# Patient Record
Sex: Male | Born: 1950 | Race: White | Hispanic: No | Marital: Married | Smoking: Never smoker
Health system: Southern US, Community
[De-identification: ages and names within clinical notes are randomized; demographics above are authoritative.]

## PROBLEM LIST (undated history)

## (undated) DIAGNOSIS — N529 Male erectile dysfunction, unspecified: Secondary | ICD-10-CM

## (undated) DIAGNOSIS — Z973 Presence of spectacles and contact lenses: Secondary | ICD-10-CM

## (undated) DIAGNOSIS — M1990 Unspecified osteoarthritis, unspecified site: Secondary | ICD-10-CM

## (undated) DIAGNOSIS — N401 Enlarged prostate with lower urinary tract symptoms: Secondary | ICD-10-CM

## (undated) DIAGNOSIS — R351 Nocturia: Secondary | ICD-10-CM

## (undated) DIAGNOSIS — K219 Gastro-esophageal reflux disease without esophagitis: Secondary | ICD-10-CM

## (undated) HISTORY — PX: TRICEPS TENDON REPAIR: SHX2577

## (undated) HISTORY — PX: UPPER GI ENDOSCOPY: SHX6162

## (undated) HISTORY — PX: TONSILLECTOMY AND ADENOIDECTOMY: SUR1326

## (undated) HISTORY — PX: KNEE ARTHROSCOPY: SUR90

## (undated) HISTORY — PX: JOINT REPLACEMENT: SHX530

## (undated) HISTORY — PX: APPENDECTOMY: SHX54

---

## 1985-08-07 HISTORY — PX: LUMBAR SPINE SURGERY: SHX701

## 2010-08-07 HISTORY — PX: BICEPS TENDON REPAIR: SHX566

## 2010-08-07 HISTORY — PX: LAPAROSCOPIC CHOLECYSTECTOMY: SUR755

## 2016-12-16 DIAGNOSIS — R079 Chest pain, unspecified: Secondary | ICD-10-CM | POA: Diagnosis not present

## 2017-04-07 HISTORY — PX: COLONOSCOPY: SHX174

## 2018-02-26 DIAGNOSIS — M1712 Unilateral primary osteoarthritis, left knee: Secondary | ICD-10-CM | POA: Diagnosis not present

## 2018-02-26 DIAGNOSIS — M25562 Pain in left knee: Secondary | ICD-10-CM | POA: Diagnosis not present

## 2018-02-26 DIAGNOSIS — S76311A Strain of muscle, fascia and tendon of the posterior muscle group at thigh level, right thigh, initial encounter: Secondary | ICD-10-CM | POA: Diagnosis not present

## 2018-02-26 DIAGNOSIS — Z9889 Other specified postprocedural states: Secondary | ICD-10-CM | POA: Diagnosis not present

## 2018-02-26 DIAGNOSIS — G8929 Other chronic pain: Secondary | ICD-10-CM | POA: Diagnosis not present

## 2018-03-11 DIAGNOSIS — R7303 Prediabetes: Secondary | ICD-10-CM | POA: Diagnosis not present

## 2018-03-11 DIAGNOSIS — Z01818 Encounter for other preprocedural examination: Secondary | ICD-10-CM | POA: Diagnosis not present

## 2018-03-11 DIAGNOSIS — N529 Male erectile dysfunction, unspecified: Secondary | ICD-10-CM | POA: Diagnosis not present

## 2018-03-11 DIAGNOSIS — M150 Primary generalized (osteo)arthritis: Secondary | ICD-10-CM | POA: Diagnosis not present

## 2018-03-12 DIAGNOSIS — I440 Atrioventricular block, first degree: Secondary | ICD-10-CM | POA: Diagnosis not present

## 2018-03-25 ENCOUNTER — Other Ambulatory Visit: Payer: Self-pay | Admitting: Orthopedic Surgery

## 2018-04-25 NOTE — Patient Instructions (Addendum)
Your procedure is scheduled on: Monday, Sept. 30, 2019   Surgery Time:  9:45AM-11:30AM   Report to [facility]  Entrance    Report to admitting at 7:15 AM   Call this number if you have problems the morning of surgery [phone]   Do not eat food or drink liquids :After Midnight.   Brush your teeth the morning of surgery.   Do NOT smoke after Midnight   Take these medicines the morning of surgery with A SIP OF WATER: None                               You may not have any metal on your body including  jewelry, and body piercings             Do not wear lotions, powders, perfumes/cologne, or deodorant                         Men may shave face and neck.   Do not bring valuables to the hospital. [place].   Contacts, dentures or bridgework may not be worn into surgery.   Leave suitcase in the car. After surgery it may be brought to your room.    Special Instructions: Bring a copy of your healthcare power of attorney and living will documents         the day of surgery if you haven't scanned them in before.              Please read over the following fact sheets you were given:  [facility] - Preparing for Surgery Before surgery, you can play an important role.  Because skin is not sterile, your skin needs to be as free of germs as possible.  You can reduce the number of germs on your skin by washing with CHG (chlorahexidine gluconate) soap before surgery.  CHG is an antiseptic cleaner which kills germs and bonds with the skin to continue killing germs even after washing. Please DO NOT use if you have an allergy to CHG or antibacterial soaps.  If your skin becomes reddened/irritated stop using the CHG and inform your nurse when you arrive at Short Stay. Do not shave (including legs and underarms) for at least 48 hours prior to the first CHG shower.  You may shave your face/neck.  Please follow these  instructions carefully:  1.  Shower with CHG Soap the night before surgery and the  morning of surgery.  2.  If you choose to wash your hair, wash your hair first as usual with your normal  shampoo.  3.  After you shampoo, rinse your hair and body thoroughly to remove the shampoo.                             4.  Use CHG as you would any other liquid soap.  You can apply chg directly to the skin and wash.  Gently with a scrungie or clean washcloth.  5.  Apply the CHG Soap to your body ONLY FROM THE NECK DOWN.   Do   not use on face/ open  Wound or open sores. Avoid contact with eyes, ears mouth and   genitals (private parts).                       Wash face,  Genitals (private parts) with your normal soap.             6.  Wash thoroughly, paying special attention to the area where your    surgery  will be performed.  7.  Thoroughly rinse your body with warm water from the neck down.  8.  DO NOT shower/wash with your normal soap after using and rinsing off the CHG Soap.                9.  Pat yourself dry with a clean towel.            10.  Wear clean pajamas.            11.  Place clean sheets on your bed the night of your first shower and do not  sleep with pets. Day of Surgery : Do not apply any lotions/deodorants the morning of surgery.  Please wear clean clothes to the hospital/surgery center.  FAILURE TO FOLLOW THESE INSTRUCTIONS MAY RESULT IN THE CANCELLATION OF YOUR SURGERY  PATIENT SIGNATURE_________________________________  NURSE SIGNATURE__________________________________  ________________________________________________________________________   Adam Phenix  An incentive spirometer is a tool that can help keep your lungs clear and active. This tool measures how well you are filling your lungs with each breath. Taking long deep breaths may help reverse or decrease the chance of developing breathing (pulmonary) problems (especially infection)  following:  A long period of time when you are unable to move or be active. BEFORE THE PROCEDURE   If the spirometer includes an indicator to show your best effort, your nurse or respiratory therapist will set it to a desired goal.  If possible, sit up straight or lean slightly forward. Try not to slouch.  Hold the incentive spirometer in an upright position. INSTRUCTIONS FOR USE  1. Sit on the edge of your bed if possible, or sit up as far as you can in bed or on a chair. 2. Hold the incentive spirometer in an upright position. 3. Breathe out normally. 4. Place the mouthpiece in your mouth and seal your lips tightly around it. 5. Breathe in slowly and as deeply as possible, raising the piston or the ball toward the top of the column. 6. Hold your breath for 3-5 seconds or for as long as possible. Allow the piston or ball to fall to the bottom of the column. 7. Remove the mouthpiece from your mouth and breathe out normally. 8. Rest for a few seconds and repeat Steps 1 through 7 at least 10 times every 1-2 hours when you are awake. Take your time and take a few normal breaths between deep breaths. 9. The spirometer may include an indicator to show your best effort. Use the indicator as a goal to work toward during each repetition. 10. After each set of 10 deep breaths, practice coughing to be sure your lungs are clear. If you have an incision (the cut made at the time of surgery), support your incision when coughing by placing a pillow or rolled up towels firmly against it. Once you are able to get out of bed, walk around indoors and cough well. You may stop using the incentive spirometer when instructed by your caregiver.  RISKS AND COMPLICATIONS  Take your time  so you do not get dizzy or light-headed.  If you are in pain, you may need to take or ask for pain medication before doing incentive spirometry. It is harder to take a deep breath if you are having pain. AFTER USE  Rest and  breathe slowly and easily.  It can be helpful to keep track of a log of your progress. Your caregiver can provide you with a simple table to help with this. If you are using the spirometer at home, follow these instructions: [place] IF:   You are having difficultly using the spirometer.  You have trouble using the spirometer as often as instructed.  Your pain medication is not giving enough relief while using the spirometer.  You develop fever of 100.5 F (38.1 C) or higher. SEEK IMMEDIATE MEDICAL CARE IF:   You cough up bloody sputum that had not been present before.  You develop fever of 102 F (38.9 C) or greater.  You develop worsening pain at or near the incision site. MAKE SURE YOU:   Understand these instructions.  Will watch your condition.  Will get help right away if you are not doing well or get worse. Document Released: 12/04/2006 Document Revised: 10/16/2011 Document Reviewed: 02/04/2007 [facility] Patient Information 2014 [place], [place].   ________________________________________________________________________

## 2018-04-29 ENCOUNTER — Other Ambulatory Visit: Payer: Self-pay

## 2018-04-29 ENCOUNTER — Encounter (HOSPITAL_COMMUNITY)
Admission: RE | Admit: 2018-04-29 | Discharge: 2018-04-29 | Disposition: A | Discharge status: Home / Self Care | Payer: PPO | Source: Ambulatory Visit | Attending: Orthopedic Surgery | Admitting: Orthopedic Surgery

## 2018-04-29 ENCOUNTER — Encounter (HOSPITAL_COMMUNITY): Payer: Self-pay

## 2018-04-29 DIAGNOSIS — M1712 Unilateral primary osteoarthritis, left knee: Secondary | ICD-10-CM | POA: Diagnosis not present

## 2018-04-29 DIAGNOSIS — Z01812 Encounter for preprocedural laboratory examination: Secondary | ICD-10-CM | POA: Insufficient documentation

## 2018-04-29 LAB — CBC WITH DIFFERENTIAL/PLATELET
BASOS ABS: 0 10*3/uL (ref 0.0–0.1)
Basophils Relative: 1 %
Eosinophils Absolute: 0.2 10*3/uL (ref 0.0–0.7)
Eosinophils Relative: 3 %
HEMATOCRIT: 40.6 % (ref 39.0–52.0)
HEMOGLOBIN: 13.8 g/dL (ref 13.0–17.0)
LYMPHS ABS: 1.3 10*3/uL (ref 0.7–4.0)
Lymphocytes Relative: 19 %
MCH: 31.6 pg (ref 26.0–34.0)
MCHC: 34 g/dL (ref 30.0–36.0)
MCV: 92.9 fL (ref 78.0–100.0)
Monocytes Absolute: 0.6 10*3/uL (ref 0.1–1.0)
Monocytes Relative: 9 %
NEUTROS ABS: 4.6 10*3/uL (ref 1.7–7.7)
NEUTROS PCT: 68 %
PLATELETS: 204 10*3/uL (ref 150–400)
RBC: 4.37 MIL/uL (ref 4.22–5.81)
RDW: 12.7 % (ref 11.5–15.5)
WBC: 6.7 10*3/uL (ref 4.0–10.5)

## 2018-04-29 LAB — COMPREHENSIVE METABOLIC PANEL
ALK PHOS: 47 U/L (ref 38–126)
ALT: 21 U/L (ref 0–44)
AST: 23 U/L (ref 15–41)
Albumin: 4.1 g/dL (ref 3.5–5.0)
Anion gap: 7 (ref 5–15)
BILIRUBIN TOTAL: 0.6 mg/dL (ref 0.3–1.2)
BUN: 25 mg/dL — AB (ref 8–23)
CALCIUM: 9.2 mg/dL (ref 8.9–10.3)
CHLORIDE: 102 mmol/L (ref 98–111)
CO2: 29 mmol/L (ref 22–32)
CREATININE: 0.9 mg/dL (ref 0.61–1.24)
Glucose, Bld: 105 mg/dL — ABNORMAL HIGH (ref 70–99)
Potassium: 4.2 mmol/L (ref 3.5–5.1)
Sodium: 138 mmol/L (ref 135–145)
Total Protein: 6.7 g/dL (ref 6.5–8.1)

## 2018-04-29 LAB — SURGICAL PCR SCREEN
MRSA, PCR: NEGATIVE
Staphylococcus aureus: NEGATIVE

## 2018-05-05 MED ORDER — BUPIVACAINE LIPOSOME 1.3 % IJ SUSP
20.0000 mL | Freq: Once | INTRAMUSCULAR | Status: DC
  Filled 2018-05-05: qty 20

## 2018-05-06 ENCOUNTER — Encounter (HOSPITAL_COMMUNITY)
Admission: RE | Disposition: A | Discharge status: Home / Self Care | Payer: Self-pay | Source: Ambulatory Visit | Attending: Orthopedic Surgery

## 2018-05-06 ENCOUNTER — Encounter (HOSPITAL_COMMUNITY): Payer: Self-pay | Admitting: *Deleted

## 2018-05-06 ENCOUNTER — Ambulatory Visit (HOSPITAL_COMMUNITY): Payer: PPO | Admitting: Anesthesiology

## 2018-05-06 ENCOUNTER — Observation Stay (HOSPITAL_COMMUNITY)
Admission: RE | Admit: 2018-05-06 | Discharge: 2018-05-07 | Disposition: A | Discharge status: Home / Self Care | Payer: PPO | Source: Ambulatory Visit | Attending: Orthopedic Surgery | Admitting: Orthopedic Surgery

## 2018-05-06 ENCOUNTER — Other Ambulatory Visit: Payer: Self-pay

## 2018-05-06 DIAGNOSIS — M25562 Pain in left knee: Secondary | ICD-10-CM | POA: Diagnosis present

## 2018-05-06 DIAGNOSIS — M25762 Osteophyte, left knee: Secondary | ICD-10-CM | POA: Insufficient documentation

## 2018-05-06 DIAGNOSIS — Z96659 Presence of unspecified artificial knee joint: Secondary | ICD-10-CM

## 2018-05-06 DIAGNOSIS — M1712 Unilateral primary osteoarthritis, left knee: Secondary | ICD-10-CM | POA: Diagnosis not present

## 2018-05-06 DIAGNOSIS — G8918 Other acute postprocedural pain: Secondary | ICD-10-CM | POA: Diagnosis not present

## 2018-05-06 HISTORY — PX: TOTAL KNEE ARTHROPLASTY: SHX125

## 2018-05-06 SURGERY — ARTHROPLASTY, KNEE, TOTAL
Anesthesia: Regional | Site: Knee | Laterality: Left

## 2018-05-06 MED ORDER — FENTANYL CITRATE (PF) 100 MCG/2ML IJ SOLN
100.0000 ug | Freq: Once | INTRAMUSCULAR | Status: AC
  Administered 2018-05-06: 50 ug via INTRAVENOUS
  Filled 2018-05-06: qty 2

## 2018-05-06 MED ORDER — METOCLOPRAMIDE HCL 5 MG PO TABS: 5.0000 mg | ORAL_TABLET | Freq: Three times a day (TID) | ORAL | Status: DC | PRN

## 2018-05-06 MED ORDER — PROPOFOL 500 MG/50ML IV EMUL
INTRAVENOUS | Status: DC | PRN
  Administered 2018-05-06: 40 ug/kg/min via INTRAVENOUS

## 2018-05-06 MED ORDER — DIPHENHYDRAMINE HCL 12.5 MG/5ML PO ELIX: 12.5000 mg | ORAL_SOLUTION | ORAL | Status: DC | PRN

## 2018-05-06 MED ORDER — SODIUM CHLORIDE 0.9 % IV SOLN
INTRAVENOUS | Status: DC
  Administered 2018-05-06 – 2018-05-07 (×2): via INTRAVENOUS

## 2018-05-06 MED ORDER — TRAMADOL HCL 50 MG PO TABS
50.0000 mg | ORAL_TABLET | Freq: Four times a day (QID) | ORAL | Status: DC
  Administered 2018-05-06 – 2018-05-07 (×4): 50 mg via ORAL
  Filled 2018-05-06 (×4): qty 1

## 2018-05-06 MED ORDER — SODIUM CHLORIDE 0.9 % IV SOLN
INTRAVENOUS | Status: DC | PRN
  Administered 2018-05-06: 40 ug/min via INTRAVENOUS

## 2018-05-06 MED ORDER — LACTATED RINGERS IV SOLN
INTRAVENOUS | Status: DC
  Administered 2018-05-06 (×2): via INTRAVENOUS

## 2018-05-06 MED ORDER — SODIUM CHLORIDE 0.9 % IJ SOLN
INTRAMUSCULAR | Status: AC
  Filled 2018-05-06: qty 20

## 2018-05-06 MED ORDER — FERROUS SULFATE 325 (65 FE) MG PO TABS
325.0000 mg | ORAL_TABLET | Freq: Three times a day (TID) | ORAL | Status: DC
  Administered 2018-05-07: 325 mg via ORAL
  Filled 2018-05-06: qty 1

## 2018-05-06 MED ORDER — TRANEXAMIC ACID 1000 MG/10ML IV SOLN
1000.0000 mg | Freq: Once | INTRAVENOUS | Status: AC
  Administered 2018-05-06: 1000 mg via INTRAVENOUS
  Filled 2018-05-06: qty 1000

## 2018-05-06 MED ORDER — CEFAZOLIN SODIUM-DEXTROSE 2-4 GM/100ML-% IV SOLN
2.0000 g | Freq: Four times a day (QID) | INTRAVENOUS | Status: AC
  Administered 2018-05-06 (×2): 2 g via INTRAVENOUS
  Filled 2018-05-06 (×2): qty 100

## 2018-05-06 MED ORDER — DOCUSATE SODIUM 100 MG PO CAPS
100.0000 mg | ORAL_CAPSULE | Freq: Two times a day (BID) | ORAL | Status: DC
  Administered 2018-05-06 – 2018-05-07 (×2): 100 mg via ORAL
  Filled 2018-05-06 (×2): qty 1

## 2018-05-06 MED ORDER — ASPIRIN EC 325 MG PO TBEC
325.0000 mg | DELAYED_RELEASE_TABLET | Freq: Two times a day (BID) | ORAL | Status: DC
  Administered 2018-05-07: 325 mg via ORAL
  Filled 2018-05-06: qty 1

## 2018-05-06 MED ORDER — GABAPENTIN 300 MG PO CAPS
300.0000 mg | ORAL_CAPSULE | Freq: Three times a day (TID) | ORAL | Status: DC
  Administered 2018-05-06 – 2018-05-07 (×3): 300 mg via ORAL
  Filled 2018-05-06 (×3): qty 1

## 2018-05-06 MED ORDER — HYDROMORPHONE HCL 1 MG/ML IJ SOLN: 0.5000 mg | INTRAMUSCULAR | Status: DC | PRN

## 2018-05-06 MED ORDER — ONDANSETRON HCL 4 MG PO TABS: 4.0000 mg | ORAL_TABLET | Freq: Four times a day (QID) | ORAL | Status: DC | PRN

## 2018-05-06 MED ORDER — TRANEXAMIC ACID 1000 MG/10ML IV SOLN
1000.0000 mg | INTRAVENOUS | Status: AC
  Administered 2018-05-06: 1000 mg via INTRAVENOUS
  Filled 2018-05-06: qty 10

## 2018-05-06 MED ORDER — FLEET ENEMA 7-19 GM/118ML RE ENEM: 1.0000 | ENEMA | Freq: Once | RECTAL | Status: DC | PRN

## 2018-05-06 MED ORDER — SODIUM CHLORIDE 0.9% FLUSH
INTRAVENOUS | Status: DC | PRN
  Administered 2018-05-06: 20 mL

## 2018-05-06 MED ORDER — ACETAMINOPHEN 500 MG PO TABS
1000.0000 mg | ORAL_TABLET | Freq: Four times a day (QID) | ORAL | Status: AC
  Administered 2018-05-06 – 2018-05-07 (×4): 1000 mg via ORAL
  Filled 2018-05-06 (×4): qty 2

## 2018-05-06 MED ORDER — BUPIVACAINE IN DEXTROSE 0.75-8.25 % IT SOLN
INTRATHECAL | Status: DC | PRN
  Administered 2018-05-06: 1.8 mL via INTRATHECAL

## 2018-05-06 MED ORDER — CHLORHEXIDINE GLUCONATE 4 % EX LIQD: 60.0000 mL | Freq: Once | CUTANEOUS | Status: DC

## 2018-05-06 MED ORDER — DEXAMETHASONE SODIUM PHOSPHATE 10 MG/ML IJ SOLN
10.0000 mg | Freq: Once | INTRAMUSCULAR | Status: AC
  Administered 2018-05-07: 10 mg via INTRAVENOUS
  Filled 2018-05-06: qty 1

## 2018-05-06 MED ORDER — ONDANSETRON HCL 4 MG/2ML IJ SOLN
INTRAMUSCULAR | Status: DC | PRN
  Administered 2018-05-06: 4 mg via INTRAVENOUS

## 2018-05-06 MED ORDER — PROPOFOL 10 MG/ML IV BOLUS
INTRAVENOUS | Status: DC | PRN
  Administered 2018-05-06: 30 mg via INTRAVENOUS

## 2018-05-06 MED ORDER — CEFAZOLIN SODIUM-DEXTROSE 2-4 GM/100ML-% IV SOLN
2.0000 g | INTRAVENOUS | Status: AC
  Administered 2018-05-06: 2 g via INTRAVENOUS
  Filled 2018-05-06: qty 100

## 2018-05-06 MED ORDER — ACETAMINOPHEN 500 MG PO TABS
1000.0000 mg | ORAL_TABLET | Freq: Once | ORAL | Status: AC
  Administered 2018-05-06: 1000 mg via ORAL
  Filled 2018-05-06: qty 2

## 2018-05-06 MED ORDER — METHOCARBAMOL 500 MG PO TABS: 500.0000 mg | ORAL_TABLET | Freq: Four times a day (QID) | ORAL | Status: DC | PRN

## 2018-05-06 MED ORDER — PROPOFOL 10 MG/ML IV BOLUS
INTRAVENOUS | Status: AC
  Filled 2018-05-06: qty 60

## 2018-05-06 MED ORDER — PROPOFOL 10 MG/ML IV BOLUS
INTRAVENOUS | Status: AC
  Filled 2018-05-06: qty 20

## 2018-05-06 MED ORDER — BUPIVACAINE LIPOSOME 1.3 % IJ SUSP
INTRAMUSCULAR | Status: DC | PRN
  Administered 2018-05-06: 20 mL

## 2018-05-06 MED ORDER — MIDAZOLAM HCL 2 MG/2ML IJ SOLN
2.0000 mg | Freq: Once | INTRAMUSCULAR | Status: AC
  Administered 2018-05-06: 2 mg via INTRAVENOUS
  Filled 2018-05-06: qty 2

## 2018-05-06 MED ORDER — BISACODYL 5 MG PO TBEC: 5.0000 mg | DELAYED_RELEASE_TABLET | Freq: Every day | ORAL | Status: DC | PRN

## 2018-05-06 MED ORDER — METHOCARBAMOL 500 MG IVPB - SIMPLE MED
500.0000 mg | Freq: Four times a day (QID) | INTRAVENOUS | Status: DC | PRN
  Administered 2018-05-06: 500 mg via INTRAVENOUS
  Filled 2018-05-06: qty 50

## 2018-05-06 MED ORDER — PANTOPRAZOLE SODIUM 40 MG PO TBEC
40.0000 mg | DELAYED_RELEASE_TABLET | Freq: Every day | ORAL | Status: DC
  Administered 2018-05-06 – 2018-05-07 (×2): 40 mg via ORAL
  Filled 2018-05-06 (×2): qty 1

## 2018-05-06 MED ORDER — BUPIVACAINE-EPINEPHRINE (PF) 0.5% -1:200000 IJ SOLN
INTRAMUSCULAR | Status: AC
  Filled 2018-05-06: qty 30

## 2018-05-06 MED ORDER — ROPIVACAINE HCL 5 MG/ML IJ SOLN
INTRAMUSCULAR | Status: DC | PRN
  Administered 2018-05-06: 30 mL via PERINEURAL

## 2018-05-06 MED ORDER — ONDANSETRON HCL 4 MG/2ML IJ SOLN: 4.0000 mg | Freq: Once | INTRAMUSCULAR | Status: DC | PRN

## 2018-05-06 MED ORDER — OXYCODONE HCL 5 MG PO TABS
5.0000 mg | ORAL_TABLET | ORAL | Status: DC | PRN
  Administered 2018-05-07 (×2): 5 mg via ORAL
  Filled 2018-05-06 (×2): qty 1

## 2018-05-06 MED ORDER — FENTANYL CITRATE (PF) 100 MCG/2ML IJ SOLN: 25.0000 ug | INTRAMUSCULAR | Status: DC | PRN

## 2018-05-06 MED ORDER — METHOCARBAMOL 500 MG IVPB - SIMPLE MED
INTRAVENOUS | Status: AC
  Administered 2018-05-06: 500 mg via INTRAVENOUS
  Filled 2018-05-06: qty 50

## 2018-05-06 MED ORDER — SODIUM CHLORIDE 0.9 % IR SOLN
Status: DC | PRN
  Administered 2018-05-06: 1000 mL

## 2018-05-06 MED ORDER — MENTHOL 3 MG MT LOZG: 1.0000 | LOZENGE | OROMUCOSAL | Status: DC | PRN

## 2018-05-06 MED ORDER — STERILE WATER FOR INJECTION IJ SOLN
INTRAMUSCULAR | Status: DC | PRN
  Administered 2018-05-06: 2000 mL

## 2018-05-06 MED ORDER — SENNOSIDES-DOCUSATE SODIUM 8.6-50 MG PO TABS: 1.0000 | ORAL_TABLET | Freq: Every evening | ORAL | Status: DC | PRN

## 2018-05-06 MED ORDER — METOCLOPRAMIDE HCL 5 MG/ML IJ SOLN: 5.0000 mg | Freq: Three times a day (TID) | INTRAMUSCULAR | Status: DC | PRN

## 2018-05-06 MED ORDER — BUPIVACAINE-EPINEPHRINE 0.5% -1:200000 IJ SOLN
INTRAMUSCULAR | Status: DC | PRN
  Administered 2018-05-06: 20 mL

## 2018-05-06 MED ORDER — PHENOL 1.4 % MT LIQD
1.0000 | OROMUCOSAL | Status: DC | PRN
  Filled 2018-05-06: qty 177

## 2018-05-06 MED ORDER — ONDANSETRON HCL 4 MG/2ML IJ SOLN: 4.0000 mg | Freq: Four times a day (QID) | INTRAMUSCULAR | Status: DC | PRN

## 2018-05-06 MED ORDER — GABAPENTIN 300 MG PO CAPS
300.0000 mg | ORAL_CAPSULE | Freq: Once | ORAL | Status: AC
  Administered 2018-05-06: 300 mg via ORAL
  Filled 2018-05-06: qty 1

## 2018-05-06 MED ORDER — ALUM & MAG HYDROXIDE-SIMETH 200-200-20 MG/5ML PO SUSP: 30.0000 mL | ORAL | Status: DC | PRN

## 2018-05-06 MED ORDER — DEXAMETHASONE SODIUM PHOSPHATE 10 MG/ML IJ SOLN
8.0000 mg | Freq: Once | INTRAMUSCULAR | Status: AC
  Administered 2018-05-06: 8 mg via INTRAVENOUS
  Filled 2018-05-06: qty 1

## 2018-05-06 MED ORDER — ZOLPIDEM TARTRATE 5 MG PO TABS: 5.0000 mg | ORAL_TABLET | Freq: Every evening | ORAL | Status: DC | PRN

## 2018-05-06 SURGICAL SUPPLY — 61 items
ARTISURF 10M VEL 10-12 GH KNEE (Knees) ×3 IMPLANT
BAG ZIPLOCK 12X15 (MISCELLANEOUS) ×3 IMPLANT
BANDAGE ACE 6X5 VEL STRL LF (GAUZE/BANDAGES/DRESSINGS) ×3 IMPLANT
BLADE SAGITTAL 13X1.27X60 (BLADE) ×2 IMPLANT
BLADE SAGITTAL 13X1.27X60MM (BLADE) ×1
BLADE SAW SGTL 83.5X18.5 (BLADE) ×3 IMPLANT
BLADE SURG 15 STRL LF DISP TIS (BLADE) ×1 IMPLANT
BLADE SURG 15 STRL SS (BLADE) ×2
BOWL SMART MIX CTS (DISPOSABLE) ×3 IMPLANT
CEMENT BONE SIMPLEX SPEEDSET (Cement) ×6 IMPLANT
CLOSURE WOUND 1/2 X4 (GAUZE/BANDAGES/DRESSINGS) ×2
COVER SURGICAL LIGHT HANDLE (MISCELLANEOUS) ×3 IMPLANT
CUFF TOURN SGL QUICK 34 (TOURNIQUET CUFF) ×2
CUFF TRNQT CYL 34X4X40X1 (TOURNIQUET CUFF) ×1 IMPLANT
DECANTER SPIKE VIAL GLASS SM (MISCELLANEOUS) ×6 IMPLANT
DRAPE INCISE IOBAN 66X45 STRL (DRAPES) ×6 IMPLANT
DRAPE U-SHAPE 47X51 STRL (DRAPES) ×3 IMPLANT
DRESSING AQUACEL AG SP 3.5X10 (GAUZE/BANDAGES/DRESSINGS) ×1 IMPLANT
DRSG AQUACEL AG ADV 3.5X10 (GAUZE/BANDAGES/DRESSINGS) ×3 IMPLANT
DRSG AQUACEL AG SP 3.5X10 (GAUZE/BANDAGES/DRESSINGS) ×3
DURAPREP 26ML APPLICATOR (WOUND CARE) ×6 IMPLANT
ELECT REM PT RETURN 15FT ADLT (MISCELLANEOUS) ×3 IMPLANT
FACESHIELD WRAPAROUND (MASK) ×3 IMPLANT
FEMUR  CMT CCR STD SZ10 L KNEE (Knees) ×2 IMPLANT
FEMUR CMT CCR STD SZ10 L KNEE (Knees) ×1 IMPLANT
FEMUR CMTD CR PERS STD SZ 5 RT (Knees) ×1 IMPLANT
GLOVE BIOGEL M STRL SZ7.5 (GLOVE) ×3 IMPLANT
GLOVE BIOGEL PI IND STRL 7.0 (GLOVE) ×1 IMPLANT
GLOVE BIOGEL PI IND STRL 7.5 (GLOVE) ×2 IMPLANT
GLOVE BIOGEL PI IND STRL 8.5 (GLOVE) ×2 IMPLANT
GLOVE BIOGEL PI INDICATOR 7.0 (GLOVE) ×2
GLOVE BIOGEL PI INDICATOR 7.5 (GLOVE) ×4
GLOVE BIOGEL PI INDICATOR 8.5 (GLOVE) ×4
GLOVE SURG ORTHO 8.0 STRL STRW (GLOVE) ×9 IMPLANT
GLOVE SURG SS PI 7.0 STRL IVOR (GLOVE) ×3 IMPLANT
GLOVE SURG SS PI 7.5 STRL IVOR (GLOVE) ×3 IMPLANT
GOWN STRL REUS W/ TWL LRG LVL3 (GOWN DISPOSABLE) ×1 IMPLANT
GOWN STRL REUS W/ TWL XL LVL3 (GOWN DISPOSABLE) ×2 IMPLANT
GOWN STRL REUS W/TWL 2XL LVL3 (GOWN DISPOSABLE) ×6 IMPLANT
GOWN STRL REUS W/TWL LRG LVL3 (GOWN DISPOSABLE) ×2
GOWN STRL REUS W/TWL XL LVL3 (GOWN DISPOSABLE) ×4
HANDPIECE INTERPULSE COAX TIP (DISPOSABLE) ×2
HOLDER FOLEY CATH W/STRAP (MISCELLANEOUS) ×3 IMPLANT
HOOD PEEL AWAY FLYTE STAYCOOL (MISCELLANEOUS) ×9 IMPLANT
MANIFOLD NEPTUNE II (INSTRUMENTS) ×3 IMPLANT
PACK TOTAL KNEE CUSTOM (KITS) ×3 IMPLANT
POSITIONER SURGICAL ARM (MISCELLANEOUS) ×3 IMPLANT
SET HNDPC FAN SPRY TIP SCT (DISPOSABLE) ×1 IMPLANT
STEM COMP PATELLA VE 38 (Knees) ×3 IMPLANT
STEM TIBIA 5 DEG SZ G L KNEE (Knees) ×1 IMPLANT
STRIP CLOSURE SKIN 1/2X4 (GAUZE/BANDAGES/DRESSINGS) ×4 IMPLANT
SUT BONE WAX W31G (SUTURE) ×3 IMPLANT
SUT MNCRL AB 4-0 PS2 18 (SUTURE) ×3 IMPLANT
SUT STRATAFIX 0 PDS 27 VIOLET (SUTURE) ×3
SUT STRATAFIX PDS+ 0 24IN (SUTURE) ×3 IMPLANT
SUTURE STRATFX 0 PDS 27 VIOLET (SUTURE) ×1 IMPLANT
SYR CONTROL 10ML LL (SYRINGE) ×6 IMPLANT
TIBIA STEM 5 DEG SZ G L KNEE (Knees) ×3 IMPLANT
TRAY FOLEY MTR SLVR 16FR STAT (SET/KITS/TRAYS/PACK) ×3 IMPLANT
WRAP KNEE MAXI GEL POST OP (GAUZE/BANDAGES/DRESSINGS) ×3 IMPLANT
YANKAUER SUCT BULB TIP 10FT TU (MISCELLANEOUS) ×3 IMPLANT

## 2018-05-06 NOTE — Anesthesia Postprocedure Evaluation (Signed)
Anesthesia Post Note  Patient: Philip Preston  Procedure(s) Performed: LEFT TOTAL KNEE ARTHROPLASTY (Left Knee)     Patient location during evaluation: PACU Anesthesia Type: Regional and Spinal Level of consciousness: oriented and awake and alert Pain management: pain level controlled Vital Signs Assessment: post-procedure vital signs reviewed and stable Respiratory status: spontaneous breathing, respiratory function stable and patient connected to nasal cannula oxygen Cardiovascular status: blood pressure returned to baseline and stable Postop Assessment: no headache, no backache, no apparent nausea or vomiting and spinal receding Anesthetic complications: no    Last Vitals:  Vitals:   05/06/18 1356 05/06/18 1455  BP: 129/76 126/75  Pulse: (!) 59 69  Resp: 14 14  Temp: (!) 36.3 C   SpO2: 100% 100%    Last Pain:  Vitals:   05/06/18 1452  TempSrc:   PainSc: 3                  Ryan P Ellender

## 2018-05-06 NOTE — Anesthesia Procedure Notes (Signed)
Anesthesia Regional Block: Adductor canal block   Pre-Anesthetic Checklist: ,, timeout performed, Correct Patient, Correct Site, Correct Laterality, Correct Procedure,, site marked, risks and benefits discussed, Surgical consent,  Pre-op evaluation,  At surgeon's request and post-op pain management  Laterality: Left  Prep: chloraprep       Needles:  Injection technique: Single-shot  Needle Type: Echogenic Stimulator Needle     Needle Length: 10cm  Needle Gauge: 21     Additional Needles:   Procedures:,,,, ultrasound used (permanent image in chart),,,,  Narrative:  Start time: 05/06/2018 9:00 AM End time: 05/06/2018 9:10 AM Injection made incrementally with aspirations every 5 mL.  Performed by: Personally  Anesthesiologist: Murvin Natal, MD  Additional Notes: Functioning IV was confirmed and monitors were applied. A time-out was performed. Hand hygiene and sterile gloves were used. The thigh was placed in a frog-leg position and prepped in a sterile fashion. A 152mm 21ga [place] echogenic stimulator needle was placed using ultrasound guidance.  Negative aspiration and negative test dose prior to incremental administration of local anesthetic. The patient tolerated the procedure well.

## 2018-05-06 NOTE — Anesthesia Preprocedure Evaluation (Addendum)
Anesthesia Evaluation  Patient identified by MRN, date of birth, ID band Patient awake    Reviewed: Allergy & Precautions, NPO status , Patient's Chart, lab work & pertinent test results  Airway Mallampati: III  TM Distance: >3 FB Neck ROM: Full    Dental no notable dental hx.    Pulmonary neg pulmonary ROS,    Pulmonary exam normal breath sounds clear to auscultation       Cardiovascular negative cardio ROS Normal cardiovascular exam Rhythm:Regular Rate:Normal     Neuro/Psych negative neurological ROS  negative psych ROS   GI/Hepatic negative GI ROS, Neg liver ROS,   Endo/Other  negative endocrine ROS  Renal/GU negative Renal ROS     Musculoskeletal  (+) Arthritis ,   Abdominal   Peds  Hematology negative hematology ROS (+)   Anesthesia Other Findings Left knee osteoarthritis  Reproductive/Obstetrics                            Anesthesia Physical Anesthesia Plan  ASA: II  Anesthesia Plan: Regional and Spinal   Post-op Pain Management:    Induction: Intravenous  PONV Risk Score and Plan: 1 and Propofol infusion and Treatment may vary due to age or medical condition  Airway Management Planned: Natural Airway  Additional Equipment:   Intra-op Plan:   Post-operative Plan:   Informed Consent: I have reviewed the patients History and Physical, chart, labs and discussed the procedure including the risks, benefits and alternatives for the proposed anesthesia with the patient or authorized representative who has indicated his/her understanding and acceptance.   Dental advisory given  Plan Discussed with: CRNA  Anesthesia Plan Comments:        Anesthesia Quick Evaluation

## 2018-05-06 NOTE — Plan of Care (Signed)
Plan of care 

## 2018-05-06 NOTE — Anesthesia Procedure Notes (Deleted)
Spinal  Patient location during procedure: OR Start time: 05/06/2018 9:31 AM End time: 05/06/2018 9:36 AM Reason for block: at surgeon's request Staffing Resident/CRNA: Anne Fu, CRNA Performed: anesthesiologist  Preanesthetic Checklist Completed: patient identified, site marked, surgical consent, pre-op evaluation, timeout performed, IV checked, risks and benefits discussed and monitors and equipment checked Spinal Block Patient position: sitting Prep: Betadine and DuraPrep Patient monitoring: heart rate, continuous pulse ox and blood pressure Approach: right paramedian Location: L2-3 Injection technique: single-shot Needle Needle type: Sprotte  Needle gauge: 24 G Needle length: 9 cm Assessment Sensory level: T6 Additional Notes Expiration date of kit checked and confirmed. Patient tolerated procedure well, without complications.

## 2018-05-06 NOTE — H&P (Signed)
Philip Preston MRN:  [id number] DOB/SEX:  1951-01-31/male  CHIEF COMPLAINT:  Painful left Knee  HISTORY: Patient is a 67 y.o. male presented with a history of pain in the left knee. Onset of symptoms was gradual starting 10 years ago with gradually worsening course since that time. Patient has been treated conservatively with over-the-counter NSAIDs and activity modification. Patient currently rates pain in the knee at 10 out of 10 with activity. There is pain at night.  PAST MEDICAL HISTORY: There are no active problems to display for this patient.  Past Medical History:  Diagnosis Date  . Arthritis    Past Surgical History:  Procedure Laterality Date  . APPENDECTOMY     age 31   . BACK SURGERY    . BICEPS TENDON REPAIR Left   . CHOLECYSTECTOMY    . COLONOSCOPY  04/2017  . KNEE ARTHROSCOPY Left   . TONSILLECTOMY AND ADENOIDECTOMY     childhood  . TRICEPS TENDON REPAIR Right    x2 second procedure to find infection source  . UPPER GI ENDOSCOPY       MEDICATIONS:   No medications prior to admission.    ALLERGIES:  No Known Allergies  REVIEW OF SYSTEMS:  A comprehensive review of systems was negative except for: Musculoskeletal: positive for arthralgias and bone pain   FAMILY HISTORY:  No family history on file.  SOCIAL HISTORY:   Social History   Tobacco Use  . Smoking status: Never Smoker  . Smokeless tobacco: Never Used  Substance Use Topics  . Alcohol use: Never    Frequency: Never     EXAMINATION:  Vital signs in last 24 hours:    There were no vitals taken for this visit.  General Appearance:    Alert, cooperative, no distress, appears stated age  Head:    Normocephalic, without obvious abnormality, atraumatic  Eyes:    PERRL, conjunctiva/corneas clear, EOM's intact, fundi    benign, both eyes       Ears:    Normal TM's and external ear canals, both ears  Nose:   Nares normal, septum midline, mucosa normal, no drainage    or sinus tenderness   Throat:   Lips, mucosa, and tongue normal; teeth and gums normal  Neck:   Supple, symmetrical, trachea midline, no adenopathy;       thyroid:  No enlargement/tenderness/nodules; no carotid   bruit or JVD  Back:     Symmetric, no curvature, ROM normal, no CVA tenderness  Lungs:     Clear to auscultation bilaterally, respirations unlabored  Chest wall:    No tenderness or deformity  Heart:    Regular rate and rhythm, S1 and S2 normal, no murmur, rub   or gallop  Abdomen:     Soft, non-tender, bowel sounds active all four quadrants,    no masses, no organomegaly  Genitalia:    Normal male without lesion, discharge or tenderness  Rectal:    Normal tone, normal prostate, no masses or tenderness;   guaiac negative stool  Extremities:   Extremities normal, atraumatic, no cyanosis or edema  Pulses:   2+ and symmetric all extremities  Skin:   Skin color, texture, turgor normal, no rashes or lesions  Lymph nodes:   Cervical, supraclavicular, and axillary nodes normal  Neurologic:   CNII-XII intact. Normal strength, sensation and reflexes      throughout    Musculoskeletal:  ROM 0-120, Ligaments intact,  Imaging Review Plain radiographs demonstrate severe degenerative joint  disease of the left knee. The overall alignment is neutral. The bone quality appears to be good for age and reported activity level.  Assessment/Plan: Primary osteoarthritis, left knee   The patient history, physical examination and imaging studies are consistent with advanced degenerative joint disease of the left knee. The patient has failed conservative treatment.  The clearance notes were reviewed.  After discussion with the patient it was felt that Total Knee Replacement was indicated. The procedure,  risks, and benefits of total knee arthroplasty were presented and reviewed. The risks including but not limited to aseptic loosening, infection, blood clots, vascular injury, stiffness, patella tracking problems complications  among others were discussed. The patient acknowledged the explanation, agreed to proceed with the plan.  Preoperative templating of the joint replacement has been completed, documented, and submitted to the Operating Room personnel in order to optimize intra-operative equipment management.    Patient's anticipated LOS is less than 2 midnights, meeting these requirements:  - Lives within 1 hour of care - Has a competent adult at home to recover with post-op recover - NO history of  - Chronic pain requiring opiods  - Diabetes  - Coronary Artery Disease  - Heart failure  - Heart attack  - Stroke  - DVT/VTE  - Cardiac arrhythmia  - Respiratory Failure/COPD  - Renal failure  - Anemia  - Advanced Liver disease       Donia Ast 05/06/2018, 7:12 AM

## 2018-05-06 NOTE — Evaluation (Signed)
Physical Therapy Evaluation Patient Details Name: Philip Preston MRN: [id number] DOB: [birth year] Today's Date: 05/06/2018   History of Present Illness  Pt is a 67 YO male s/p L TKR on 05/06/18. PMH includes OA, back surgery, L biceps tendon repair, L knee arthroscopy, R triceps tendon repair.   Clinical Impression   Pt presents with increased time/effort to perform bed mobility and transfers, difficulty ambulating secondary to decreased sensation in bilat LEs L>R, and decreased tolerance for ambulation. Pt mostly limited this session by temporary foot sensation changes due to spinal anesthesia. Pt to benefit from acute PT to address deficits. Pt ambulated hallway distance, but required rest breaks requested by PT for safety given foot sensation. PT to continue to progress mobility as able, and will continue to follow acutely.     Follow Up Recommendations Follow surgeon's recommendation for DC plan and follow-up therapies;Supervision for mobility/OOB(HHPT )    Equipment Recommendations  None recommended by PT    Recommendations for Other Services       Precautions / Restrictions Precautions Precautions: Fall Restrictions Weight Bearing Restrictions: No Other Position/Activity Restrictions: WBAT       Mobility  Bed Mobility Overal bed mobility: Needs Assistance Bed Mobility: Supine to Sit     Supine to sit: HOB elevated;Min guard     General bed mobility comments: Min guard for safety. Verbal cuing for sequencing to get to EOB.   Transfers Overall transfer level: Needs assistance Equipment used: Rolling walker (2 wheeled) Transfers: Sit to/from Stand Sit to Stand: Min assist         General transfer comment: Min assist for power up and steadying. It was apparent pt had decreased sensation of bilat feet L>R upon standing given unsteadiness and placement of feet one over the other. Pt stood for 1 minute with weight shifts and steps forward, pt asked to sit on bed.  After a couple of minutes, ambulation attempted because pt endorsing increased feeling in feet.  Ambulation/Gait Ambulation/Gait assistance: Min guard;+2 safety/equipment(close chair follow ) Gait Distance (Feet): 40 Feet Assistive device: Rolling walker (2 wheeled) Gait Pattern/deviations: Step-to pattern;Narrow base of support;Decreased weight shift to left Gait velocity: decr    General Gait Details: Pt with narrow-based ambulation. Pt asked if he always ambulated with narrow base, but pt states that he was just having trouble feeling L foot. Pt asked to sit in chair following behind for safety. Pt again endorsed increased sensation after short ambulation distance of 20 ft, pt wanting to attempt ambulation again after approximately 1 minute rest. Pt ambulated 20 additional feet with less unsteadiness, pt contibutes to increased feeling in feet.   Stairs            Wheelchair Mobility    Modified Rankin (Stroke Patients Only)       Balance Overall balance assessment: Needs assistance Sitting-balance support: No upper extremity supported Sitting balance-Leahy Scale: Good       Standing balance-Leahy Scale: Poor Standing balance comment: relies on RW for steadying                              Pertinent Vitals/Pain Pain Assessment: 0-10 Pain Score: 1  Pain Location: L knee  Pain Descriptors / Indicators: Discomfort;Burning Pain Intervention(s): Limited activity within patient's tolerance;Ice applied;Monitored during session;Premedicated before session    Home Living Family/patient expects to be discharged to:: Private residence Living Arrangements: Spouse/significant other Available Help at Discharge: Family;Available PRN/intermittently Type  of Home: House Home Access: Stairs to enter Entrance Stairs-Rails: None Entrance Stairs-Number of Steps: 3 Home Layout: One level Home Equipment: Crutches;Walker - 2 wheels;Cane - single point      Prior Function  Level of Independence: Independent               Hand Dominance   Dominant Hand: Right    Extremity/Trunk Assessment   Upper Extremity Assessment Upper Extremity Assessment: Overall WFL for tasks assessed    Lower Extremity Assessment Lower Extremity Assessment: Overall WFL for tasks assessed;LLE deficits/detail LLE Deficits / Details: suspected post-surgical LLE weakness; able to perform SLR x1, quad sets, ankle pumps  LLE Sensation: WNL(WNL in supine, decreased sensation upon standing. Subsided somewhat with ambulation )    Cervical / Trunk Assessment Cervical / Trunk Assessment: Normal  Communication   Communication: No difficulties  Cognition Arousal/Alertness: Awake/alert Behavior During Therapy: WFL for tasks assessed/performed Overall Cognitive Status: Within Functional Limits for tasks assessed                                        General Comments      Exercises Total Joint Exercises Ankle Circles/Pumps: AROM;Both;5 reps;Seated Goniometric ROM: knee AAROM approximately 5-90*, limited by pt pain    Assessment/Plan    PT Assessment Patient needs continued PT services  PT Problem List Decreased strength;Pain;Decreased range of motion;Decreased activity tolerance;Decreased knowledge of use of DME;Decreased safety awareness;Decreased balance;Decreased mobility       PT Treatment Interventions Therapeutic activities;DME instruction;Gait training;Therapeutic exercise;Patient/family education;Stair training;Balance training;Functional mobility training    PT Goals (Current goals can be found in the Care Plan section)  Acute Rehab PT Goals PT Goal Formulation: With patient Time For Goal Achievement: 05/13/18 Potential to Achieve Goals: Good    Frequency 7X/week   Barriers to discharge        Co-evaluation               AM-PAC PT "6 Clicks" Daily Activity  Outcome Measure Difficulty turning over in bed (including adjusting  bedclothes, sheets and blankets)?: A Little Difficulty moving from lying on back to sitting on the side of the bed? : Unable Difficulty sitting down on and standing up from a chair with arms (e.g., wheelchair, bedside commode, etc,.)?: Unable Help needed moving to and from a bed to chair (including a wheelchair)?: A Little Help needed walking in hospital room?: A Little Help needed climbing 3-5 steps with a railing? : A Little 6 Click Score: 14    End of Session Equipment Utilized During Treatment: Gait belt Activity Tolerance: Other (comment);Patient tolerated treatment well(ambulation limited by decreased sensation in feet ) Patient left: in chair;with chair alarm set;with call bell/phone within reach;with family/visitor present;with SCD's reapplied(in bone foam ) Nurse Communication: Mobility status PT Visit Diagnosis: Other abnormalities of gait and mobility (R26.89);Difficulty in walking, not elsewhere classified (R26.2)    Time: 9326-7124 PT Time Calculation (min) (ACUTE ONLY): 33 min   Charges:   PT Evaluation $PT Eval Low Complexity: 1 Low PT Treatments $Gait Training: 8-22 mins        Julien Girt, PT Acute Rehabilitation Services Pager [phone]  Office [phone]   Roxine Caddy D Elonda Husky 05/06/2018, 6:37 PM

## 2018-05-06 NOTE — Progress Notes (Signed)
Assisted Dr. Ellender with left, ultrasound guided, adductor canal block. Side rails up, monitors on throughout procedure. See vital signs in flow sheet. Tolerated Procedure well.  

## 2018-05-06 NOTE — Anesthesia Procedure Notes (Signed)
Spinal  Patient location during procedure: OR Start time: 05/06/2018 9:31 AM End time: 05/06/2018 9:36 AM Reason for block: at surgeon's request Staffing Resident/CRNA: Anne Fu, CRNA Performed: resident/CRNA  Preanesthetic Checklist Completed: patient identified, site marked, surgical consent, pre-op evaluation, timeout performed, IV checked, risks and benefits discussed and monitors and equipment checked Spinal Block Patient position: sitting Prep: DuraPrep Patient monitoring: heart rate, continuous pulse ox and blood pressure Approach: right paramedian Location: L2-3 Injection technique: single-shot Needle Needle type: Pencan  Needle gauge: 24 G Needle length: 9 cm Assessment Sensory level: T6 Additional Notes  Functioning IV was confirmed and monitors were applied. Expiration date of kit checked and confirmed. Sterile prep and drape, including hand hygiene and sterile gloves were used. The patient was positioned and the spine was prepped. The skin was anesthetized with lidocaine.  Free flow of clear CSF was obtained prior to injecting local anesthetic into the CSF X 1 attempt.  The spinal needle aspirated freely following injection.  The needle was carefully withdrawn. Patient tolerated procedure well, without complications. Loss of motor and sensory on exam post injection.

## 2018-05-06 NOTE — Transfer of Care (Signed)
Immediate Anesthesia Transfer of Care Note  Patient: Philip Preston  Procedure(s) Performed: LEFT TOTAL KNEE ARTHROPLASTY (Left Knee)  Patient Location: PACU  Anesthesia Type:Spinal  Level of Consciousness: awake, alert , oriented and patient cooperative  Airway & Oxygen Therapy: Patient Spontanous Breathing and Patient connected to face mask oxygen  Post-op Assessment: Report given to RN and Post -op Vital signs reviewed and stable  Post vital signs: Reviewed and stable  Last Vitals:  Vitals Value Taken Time  BP 110/71 05/06/2018 11:30 AM  Temp    Pulse 73 05/06/2018 11:31 AM  Resp 17 05/06/2018 11:31 AM  SpO2 100 % 05/06/2018 11:31 AM  Vitals shown include unvalidated device data.  Last Pain:  Vitals:   05/06/18 0917  PainSc: 0-No pain         Complications: No apparent anesthesia complications

## 2018-05-07 ENCOUNTER — Encounter (HOSPITAL_COMMUNITY): Payer: Self-pay | Admitting: Orthopedic Surgery

## 2018-05-07 DIAGNOSIS — M1712 Unilateral primary osteoarthritis, left knee: Secondary | ICD-10-CM | POA: Diagnosis not present

## 2018-05-07 DIAGNOSIS — Z96652 Presence of left artificial knee joint: Secondary | ICD-10-CM | POA: Diagnosis not present

## 2018-05-07 LAB — BASIC METABOLIC PANEL
Anion gap: 6 (ref 5–15)
BUN: 15 mg/dL (ref 8–23)
CALCIUM: 8.7 mg/dL — AB (ref 8.9–10.3)
CO2: 29 mmol/L (ref 22–32)
CREATININE: 0.84 mg/dL (ref 0.61–1.24)
Chloride: 101 mmol/L (ref 98–111)
GFR calc Af Amer: >60 mL/min (ref >60)
GLUCOSE: 117 mg/dL — AB (ref 70–99)
Potassium: 4.2 mmol/L (ref 3.5–5.1)
Sodium: 136 mmol/L (ref 135–145)

## 2018-05-07 LAB — CBC
HCT: 33.8 % — ABNORMAL LOW (ref 39.0–52.0)
Hemoglobin: 11.8 g/dL — ABNORMAL LOW (ref 13.0–17.0)
MCH: 32.4 pg (ref 26.0–34.0)
MCHC: 34.9 g/dL (ref 30.0–36.0)
MCV: 92.9 fL (ref 78.0–100.0)
PLATELETS: 179 10*3/uL (ref 150–400)
RBC: 3.64 MIL/uL — ABNORMAL LOW (ref 4.22–5.81)
RDW: 12.6 % (ref 11.5–15.5)
WBC: 12.1 10*3/uL — AB (ref 4.0–10.5)

## 2018-05-07 MED ORDER — OXYCODONE HCL 5 MG PO TABS: 5.0000 mg | ORAL_TABLET | Freq: Four times a day (QID) | ORAL | 0 refills | Status: DC | PRN

## 2018-05-07 MED ORDER — METHOCARBAMOL 500 MG PO TABS: 500.0000 mg | ORAL_TABLET | Freq: Four times a day (QID) | ORAL | 0 refills | Status: DC | PRN

## 2018-05-07 MED ORDER — ASPIRIN 325 MG PO TBEC: 325.0000 mg | DELAYED_RELEASE_TABLET | Freq: Two times a day (BID) | ORAL | 0 refills | Status: DC

## 2018-05-07 MED ORDER — ACETAMINOPHEN 500 MG PO TABS: 1000.0000 mg | ORAL_TABLET | Freq: Four times a day (QID) | ORAL | 0 refills | Status: DC

## 2018-05-07 NOTE — Progress Notes (Signed)
Physical Therapy Treatment Patient Details Name: Philip Preston MRN: [id number] DOB: Jul 06, 1951 Today's Date: 05/07/2018    History of Present Illness Pt is a 67 YO male s/p L TKR on 05/06/18. PMH includes OA, back surgery, L biceps tendon repair, L knee arthroscopy, R triceps tendon repair.     PT Comments    Pt ambulated in hallway and practiced safe stair technique with spouse observing.  Pt also performed LE exercises.  Pt provided with stair and HEP handouts.  Pt feels ready for d/c home today.    Follow Up Recommendations  Follow surgeon's recommendation for DC plan and follow-up therapies;Supervision for mobility/OOB     Equipment Recommendations  None recommended by PT    Recommendations for Other Services       Precautions / Restrictions Precautions Precautions: Fall Restrictions Weight Bearing Restrictions: No Other Position/Activity Restrictions: WBAT     Mobility  Bed Mobility Overal bed mobility: Needs Assistance Bed Mobility: Supine to Sit     Supine to sit: Supervision;HOB elevated        Transfers Overall transfer level: Needs assistance Equipment used: Rolling walker (2 wheeled) Transfers: Sit to/from Stand Sit to Stand: Min guard;Supervision         General transfer comment: verbal cues for UE and LE positioning  Ambulation/Gait Ambulation/Gait assistance: Min guard Gait Distance (Feet): 160 Feet Assistive device: Rolling walker (2 wheeled) Gait Pattern/deviations: Step-to pattern;Step-through pattern;Decreased stance time - left;Antalgic     General Gait Details: verbal cues for sequencing, step length, RW positioning   Stairs Stairs: Yes Stairs assistance: Min assist;Min guard Stair Management: One rail Left;Step to pattern;Forwards;With cane Number of Stairs: 3 General stair comments: verbal cues for sequence, safety, technique; min assist to steady with first performance, performed again min/guard; spouse present and  observed   Wheelchair Mobility    Modified Rankin (Stroke Patients Only)       Balance                                            Cognition Arousal/Alertness: Awake/alert Behavior During Therapy: WFL for tasks assessed/performed Overall Cognitive Status: Within Functional Limits for tasks assessed                                        Exercises Total Joint Exercises Ankle Circles/Pumps: AROM;Both;10 reps Short Arc Quad: AROM;10 reps;Left Heel Slides: AAROM;10 reps;Seated;Left;Supine Hip ABduction/ADduction: AROM;Left;10 reps Straight Leg Raises: AAROM;10 reps;Left Goniometric ROM: L knee AAROM approx 90* flexion sitting    General Comments        Pertinent Vitals/Pain Pain Assessment: 0-10 Pain Score: 4  Pain Location: L knee  Pain Descriptors / Indicators: Aching;Sore Pain Intervention(s): Monitored during session;Limited activity within patient's tolerance;Repositioned    Home Living                      Prior Function            PT Goals (current goals can now be found in the care plan section) Progress towards PT goals: Progressing toward goals    Frequency    7X/week      PT Plan Current plan remains appropriate    Co-evaluation  AM-PAC PT "6 Clicks" Daily Activity  Outcome Measure  Difficulty turning over in bed (including adjusting bedclothes, sheets and blankets)?: A Little Difficulty moving from lying on back to sitting on the side of the bed? : A Little Difficulty sitting down on and standing up from a chair with arms (e.g., wheelchair, bedside commode, etc,.)?: A Little Help needed moving to and from a bed to chair (including a wheelchair)?: A Little Help needed walking in hospital room?: A Little Help needed climbing 3-5 steps with a railing? : A Little 6 Click Score: 18    End of Session Equipment Utilized During Treatment: Gait belt Activity Tolerance: Patient  tolerated treatment well Patient left: in chair;with call bell/phone within reach;with family/visitor present Nurse Communication: Mobility status PT Visit Diagnosis: Other abnormalities of gait and mobility (R26.89);Difficulty in walking, not elsewhere classified (R26.2)     Time: 2458-0998 PT Time Calculation (min) (ACUTE ONLY): 24 min  Charges:  $Gait Training: 8-22 mins $Therapeutic Exercise: 8-22 mins                   Carmelia Bake, PT, DPT Acute Rehabilitation Services Office: [phone] Pager: [phone]  Trena Platt 05/07/2018, 11:39 AM

## 2018-05-07 NOTE — Progress Notes (Signed)
SPORTS MEDICINE AND JOINT REPLACEMENT  Lara Mulch, MD    Carlyon Shadow, PA-C [address], [place], [place]  [id number]                             [phone]   PROGRESS NOTE  Subjective:  negative for Chest Pain  negative for Shortness of Breath  negative for Nausea/Vomiting   negative for Calf Pain  negative for Bowel Movement   Tolerating Diet: yes         Patient reports pain as 3 on 0-10 scale.    Objective: Vital signs in last 24 hours:    Patient Vitals for the past 24 hrs:  BP Temp Temp src Pulse Resp SpO2 Height Weight  05/07/18 0513 137/76 97.8 F (36.6 C) Oral (!) 58 16 100 % - -  05/07/18 0117 120/76 97.8 F (36.6 C) Oral 61 - 98 % - -  05/06/18 2053 (!) 141/64 (!) 97.4 F (36.3 C) Oral 65 15 98 % - -  05/06/18 1732 133/77 (!) 97.5 F (36.4 C) Oral 66 14 100 % - -  05/06/18 1608 134/83 97.7 F (36.5 C) Oral 62 14 100 % - -  05/06/18 1455 126/75 - - 69 14 100 % - -  05/06/18 1356 129/76 (!) 97.4 F (36.3 C) Oral (!) 59 14 100 % - -  05/06/18 1300 126/80 (!) 97.5 F (36.4 C) Oral (!) 51 18 100 % 5\' 9"  (1.753 m) 83 kg  05/06/18 1254 126/80 (!) 97.5 F (36.4 C) - (!) 51 18 100 % - -  05/06/18 1230 115/72 (!) 97.2 F (36.2 C) - (!) 58 16 98 % - -  05/06/18 1215 111/78 - - (!) 56 17 98 % - -  05/06/18 1200 120/73 - - (!) 52 13 100 % - -  05/06/18 1145 120/70 - - 62 18 100 % - -  05/06/18 1130 110/71 (!) 97.5 F (36.4 C) - 71 14 100 % - -  05/06/18 0917 - - - (!) 55 11 100 % - -  05/06/18 0916 - - - 60 14 100 % - -  05/06/18 0915 122/72 - - 61 (!) 23 99 % - -  05/06/18 0914 - - - (!) 57 13 100 % - -  05/06/18 0913 - - - (!) 59 10 100 % - -  05/06/18 0912 - - - 66 11 100 % - -  05/06/18 0911 - - - (!) 59 17 100 % - -  05/06/18 0910 120/72 - - (!) 57 12 100 % - -  05/06/18 0909 - - - (!) 58 12 100 % - -  05/06/18 0908 - - - (!) 59 18 100 % - -  05/06/18 0907 - - - (!) 59 16 100 % - -  05/06/18 0906 - - - (!) 57 14 100 % - -  05/06/18 0905  125/72 - - (!) 58 15 100 % - -  05/06/18 0904 - - - (!) 58 20 100 % - -  05/06/18 0903 - - - (!) 58 13 100 % - -  05/06/18 0902 - - - (!) 58 14 99 % - -  05/06/18 0901 - - - (!) 56 14 99 % - -  05/06/18 0900 138/79 - - (!) 59 14 98 % - -  05/06/18 0859 - - - (!) 56 16 99 % - -  05/06/18 0858 - - - (!) 57 17 100 % - -  05/06/18 0751 - - - - - - 5\' 9"  (1.753 m) 83.9 kg  05/06/18 0745 138/73 (!) 97.3 F (36.3 C) - (!) 57 18 96 % - -    @flow {1959:LAST@   Intake/Output from previous day:   09/30 0701 - 10/01 0700 In: 3416 [P.O.:540; I.V.:2376] Out: 3125 [Urine:3025]   Intake/Output this shift:   No intake/output data recorded.   Intake/Output      09/30 0701 - 10/01 0700 10/01 0701 - 10/02 0700   P.O. 540    I.V. (mL/kg) 2376 (28.6)    Other 0    IV Piggyback 500    Total Intake(mL/kg) 3416 (41.2)    Urine (mL/kg/hr) 3025    Stool 0    Blood 100    Total Output 3125    Net +291            LABORATORY DATA: Recent Labs    05/07/18 0515  WBC 12.1*  HGB 11.8*  HCT 33.8*  PLT 179   Recent Labs    05/07/18 0515  NA 136  K 4.2  CL 101  CO2 29  BUN 15  CREATININE 0.84  GLUCOSE 117*  CALCIUM 8.7*   No results found for: INR, PROTIME  Examination:  General appearance: alert, cooperative and no distress Extremities: extremities normal, atraumatic, no cyanosis or edema  Wound Exam: clean, dry, intact   Drainage:  None: wound tissue dry  Motor Exam: Quadriceps and Hamstrings Intact  Sensory Exam: Superficial Peroneal, Deep Peroneal and Tibial normal   Assessment:    1 Day Post-Op  Procedure(s) (LRB): LEFT TOTAL KNEE ARTHROPLASTY (Left)  ADDITIONAL DIAGNOSIS:  Active Problems:   S/P total knee replacement     Plan: Physical Therapy as ordered Weight Bearing as Tolerated (WBAT)  DVT Prophylaxis:  Aspirin  DISCHARGE PLAN: Home  DISCHARGE NEEDS: HHPT     Patient doing great, expected D/C home today  Patient's anticipated LOS is less than 2  midnights, meeting these requirements: - Lives within 1 hour of care - Has a competent adult at home to recover with post-op recover - NO history of  - Chronic pain requiring opiods  - Diabetes  - Coronary Artery Disease  - Heart failure  - Heart attack  - Stroke  - DVT/VTE  - Cardiac arrhythmia  - Respiratory Failure/COPD  - Renal failure  - Anemia  - Advanced Liver disease        Donia Ast 05/07/2018, 7:06 AM

## 2018-05-07 NOTE — Discharge Summary (Signed)
SPORTS MEDICINE & JOINT REPLACEMENT   Lara Mulch, MD   Carlyon Shadow, PA-C [address], [place], [place]  [address]                             [phone]  PATIENT ID: Philip Preston        MRN:  [id number]          DOB/AGE: 1951/06/14 / 67 y.o.    DISCHARGE SUMMARY  ADMISSION DATE:    05/06/2018 DISCHARGE DATE:   05/07/2018   ADMISSION DIAGNOSIS: Lt. Knee osteoarthritis    DISCHARGE DIAGNOSIS:  Lt. Knee osteoarthritis    ADDITIONAL DIAGNOSIS: Active Problems:   S/P total knee replacement  Past Medical History:  Diagnosis Date  . Arthritis     PROCEDURE: Procedure(s): LEFT TOTAL KNEE ARTHROPLASTY on 05/06/2018  CONSULTS:    HISTORY:  See H&P in chart  HOSPITAL COURSE:  Philip Preston is a 67 y.o. admitted on 05/06/2018 and found to have a diagnosis of Lt. Knee osteoarthritis.  After appropriate laboratory studies were obtained  they were taken to the operating room on 05/06/2018 and underwent Procedure(s): LEFT TOTAL KNEE ARTHROPLASTY.   They were given perioperative antibiotics:  Anti-infectives (From admission, onward)   Start     Dose/Rate Route Frequency Ordered Stop   05/06/18 1530  ceFAZolin (ANCEF) IVPB 2g/100 mL premix     2 g 200 mL/hr over 30 Minutes Intravenous Every 6 hours 05/06/18 1311 05/06/18 2121   05/06/18 0730  ceFAZolin (ANCEF) IVPB 2g/100 mL premix     2 g 200 mL/hr over 30 Minutes Intravenous On call to O.R. 05/06/18 1638 05/06/18 4536    .  Patient given tranexamic acid IV or topical and exparel intra-operatively.  Tolerated the procedure well.    POD# 1: Vital signs were stable.  Patient denied Chest pain, shortness of breath, or calf pain.  Patient was started on Aspirin twice daily at 8am.  Consults to PT, OT, and care management were made.  The patient was weight bearing as tolerated.  CPM was placed on the operative leg 0-90 degrees for 6-8 hours a day. When out of the CPM, patient was placed in the foam block to  achieve full extension. Incentive spirometry was taught.  Dressing was changed.       POD #2, Continued  PT for ambulation and exercise program.  IV saline locked.  O2 discontinued.    The remainder of the hospital course was dedicated to ambulation and strengthening.   The patient was discharged on 1 Day Post-Op in  Good condition.  Blood products given:none  DIAGNOSTIC STUDIES: Recent vital signs:  Patient Vitals for the past 24 hrs:  BP Temp Temp src Pulse Resp SpO2  05/07/18 1213 - - - - - 96 %  05/07/18 0841 136/69 97.7 F (36.5 C) Oral (!) 59 15 100 %  05/07/18 0513 137/76 97.8 F (36.6 C) Oral (!) 58 16 100 %  05/07/18 0117 120/76 97.8 F (36.6 C) Oral 61 - 98 %  05/06/18 2053 (!) 141/64 (!) 97.4 F (36.3 C) Oral 65 15 98 %  05/06/18 1732 133/77 (!) 97.5 F (36.4 C) Oral 66 14 100 %  05/06/18 1608 134/83 97.7 F (36.5 C) Oral 62 14 100 %  05/06/18 1455 126/75 - - 69 14 100 %  05/06/18 1356 129/76 (!) 97.4 F (36.3 C) Oral (!) 59 14 100 %  Recent laboratory studies: Recent Labs    05/07/18 0515  WBC 12.1*  HGB 11.8*  HCT 33.8*  PLT 179   Recent Labs    05/07/18 0515  NA 136  K 4.2  CL 101  CO2 29  BUN 15  CREATININE 0.84  GLUCOSE 117*  CALCIUM 8.7*   No results found for: INR, PROTIME   Recent Radiographic Studies :  No results found.  DISCHARGE INSTRUCTIONS: Discharge Instructions    CPM   Complete by:  As directed    Continuous passive motion machine (CPM):      Use the CPM from 0 to 90 for 4-6 hours per day.      You may increase by 10 per day.  You may break it up into 2 or 3 sessions per day.      Use CPM for 2 weeks or until you are told to stop.   Call MD / Call 911   Complete by:  As directed    If you experience chest pain or shortness of breath, CALL 911 and be transported to the hospital emergency room.  If you develope a fever above 101 F, pus (white drainage) or increased drainage or redness at the wound, or calf pain, call  your surgeon's office.   Constipation Prevention   Complete by:  As directed    Drink plenty of fluids.  Prune juice may be helpful.  You may use a stool softener, such as Colace (over the counter) 100 mg twice a day.  Use MiraLax (over the counter) for constipation as needed.   Diet - low sodium heart healthy   Complete by:  As directed    Discharge instructions   Complete by:  As directed    INSTRUCTIONS AFTER JOINT REPLACEMENT   Remove items at home which could result in a fall. This includes throw rugs or furniture in walking pathways ICE to the affected joint every three hours while awake for 30 minutes at a time, for at least the first 3-5 days, and then as needed for pain and swelling.  Continue to use ice for pain and swelling. You may notice swelling that will progress down to the foot and ankle.  This is normal after surgery.  Elevate your leg when you are not up walking on it.   Continue to use the breathing machine you got in the hospital (incentive spirometer) which will help keep your temperature down.  It is common for your temperature to cycle up and down following surgery, especially at night when you are not up moving around and exerting yourself.  The breathing machine keeps your lungs expanded and your temperature down.   DIET:  As you were doing prior to hospitalization, we recommend a well-balanced diet.  DRESSING / WOUND CARE / SHOWERING  Keep the surgical dressing until follow up.  The dressing is water proof, so you can shower without any extra covering.  IF THE DRESSING FALLS OFF or the wound gets wet inside, change the dressing with sterile gauze.  Please use good hand washing techniques before changing the dressing.  Do not use any lotions or creams on the incision until instructed by your surgeon.    ACTIVITY  Increase activity slowly as tolerated, but follow the weight bearing instructions below.   No driving for 6 weeks or until further direction given by your  physician.  You cannot drive while taking narcotics.  No lifting or carrying greater than 10 lbs. until further  directed by your surgeon. Avoid periods of inactivity such as sitting longer than an hour when not asleep. This helps prevent blood clots.  You may return to work once you are authorized by your doctor.     WEIGHT BEARING   Weight bearing as tolerated with assist device (walker, cane, etc) as directed, use it as long as suggested by your surgeon or therapist, typically at least 4-6 weeks.   EXERCISES  Results after joint replacement surgery are often greatly improved when you follow the exercise, range of motion and muscle strengthening exercises prescribed by your doctor. Safety measures are also important to protect the joint from further injury. Any time any of these exercises cause you to have increased pain or swelling, decrease what you are doing until you are comfortable again and then slowly increase them. If you have problems or questions, call your caregiver or physical therapist for advice.   Rehabilitation is important following a joint replacement. After just a few days of immobilization, the muscles of the leg can become weakened and shrink (atrophy).  These exercises are designed to build up the tone and strength of the thigh and leg muscles and to improve motion. Often times heat used for twenty to thirty minutes before working out will loosen up your tissues and help with improving the range of motion but do not use heat for the first two weeks following surgery (sometimes heat can increase post-operative swelling).   These exercises can be done on a training (exercise) mat, on the floor, on a table or on a bed. Use whatever works the best and is most comfortable for you.    Use music or television while you are exercising so that the exercises are a pleasant break in your day. This will make your life better with the exercises acting as a break in your routine that you  can look forward to.   Perform all exercises about fifteen times, three times per day or as directed.  You should exercise both the operative leg and the other leg as well.   Exercises include:   Quad Sets - Tighten up the muscle on the front of the thigh (Quad) and hold for 5-10 seconds.   Straight Leg Raises - With your knee straight (if you were given a brace, keep it on), lift the leg to 60 degrees, hold for 3 seconds, and slowly lower the leg.  Perform this exercise against resistance later as your leg gets stronger.  Leg Slides: Lying on your back, slowly slide your foot toward your buttocks, bending your knee up off the floor (only go as far as is comfortable). Then slowly slide your foot back down until your leg is flat on the floor again.  Angel Wings: Lying on your back spread your legs to the side as far apart as you can without causing discomfort.  Hamstring Strength:  Lying on your back, push your heel against the floor with your leg straight by tightening up the muscles of your buttocks.  Repeat, but this time bend your knee to a comfortable angle, and push your heel against the floor.  You may put a pillow under the heel to make it more comfortable if necessary.   A rehabilitation program following joint replacement surgery can speed recovery and prevent re-injury in the future due to weakened muscles. Contact your doctor or a physical therapist for more information on knee rehabilitation.    CONSTIPATION  Constipation is defined medically as fewer than  three stools per week and severe constipation as less than one stool per week.  Even if you have a regular bowel pattern at home, your normal regimen is likely to be disrupted due to multiple reasons following surgery.  Combination of anesthesia, postoperative narcotics, change in appetite and fluid intake all can affect your bowels.   YOU MUST use at least one of the following options; they are listed in order of increasing strength  to get the job done.  They are all available over the counter, and you may need to use some, POSSIBLY even all of these options:    Drink plenty of fluids (prune juice may be helpful) and high fiber foods Colace 100 mg by mouth twice a day  Senokot for constipation as directed and as needed Dulcolax (bisacodyl), take with full glass of water  Miralax (polyethylene glycol) once or twice a day as needed.  If you have tried all these things and are unable to have a bowel movement in the first 3-4 days after surgery call either your surgeon or your primary doctor.    If you experience loose stools or diarrhea, hold the medications until you stool forms back up.  If your symptoms do not get better within 1 week or if they get worse, check with your doctor.  If you experience "the worst abdominal pain ever" or develop nausea or vomiting, please contact the office immediately for further recommendations for treatment.   ITCHING:  If you experience itching with your medications, try taking only a single pain pill, or even half a pain pill at a time.  You can also use Benadryl over the counter for itching or also to help with sleep.   TED HOSE STOCKINGS:  Use stockings on both legs until for at least 2 weeks or as directed by physician office. They may be removed at night for sleeping.  MEDICATIONS:  See your medication summary on the "After Visit Summary" that nursing will review with you.  You may have some home medications which will be placed on hold until you complete the course of blood thinner medication.  It is important for you to complete the blood thinner medication as prescribed.  PRECAUTIONS:  If you experience chest pain or shortness of breath - call 911 immediately for transfer to the hospital emergency department.   If you develop a fever greater that 101 F, purulent drainage from wound, increased redness or drainage from wound, foul odor from the wound/dressing, or calf pain - CONTACT  YOUR SURGEON.                                                   FOLLOW-UP APPOINTMENTS:  If you do not already have a post-op appointment, please call the office for an appointment to be seen by your surgeon.  Guidelines for how soon to be seen are listed in your "After Visit Summary", but are typically between 1-4 weeks after surgery.  OTHER INSTRUCTIONS:   Knee Replacement:  Do not place pillow under knee, focus on keeping the knee straight while resting. CPM instructions: 0-90 degrees, 2 hours in the morning, 2 hours in the afternoon, and 2 hours in the evening. Place foam block, curve side up under heel at all times except when in CPM or when walking.  DO NOT modify, tear, cut, or  change the foam block in any way.  MAKE SURE YOU:  Understand these instructions.  Get help right away if you are not doing well or get worse.    Thank you for letting us be a part of your medical care team.  It is a privilege we respect greatly.  We hope these instructions will help you stay on track for a fast and full recovery!   Increase activity slowly as tolerated   Complete by:  As directed       DISCHARGE MEDICATIONS:   Allergies as of 05/07/2018   No Known Allergies     Medication List    TAKE these medications   acetaminophen 500 MG tablet Commonly known as:  TYLENOL Take 2 tablets (1,000 mg total) by mouth every 6 (six) hours.   aspirin 325 MG EC tablet Take 1 tablet (325 mg total) by mouth 2 (two) times daily. What changed:    medication strength  how much to take  when to take this   glucosamine-chondroitin 500-400 MG tablet Take 1 tablet by mouth 3 (three) times daily.   methocarbamol 500 MG tablet Commonly known as:  ROBAXIN Take 1-2 tablets (500-1,000 mg total) by mouth every 6 (six) hours as needed for muscle spasms.   multivitamin with minerals Tabs tablet Take 1 tablet by mouth daily.   oxyCODONE 5 MG immediate release tablet Commonly known as:  Oxy  IR/ROXICODONE Take 1-2 tablets (5-10 mg total) by mouth every 6 (six) hours as needed for moderate pain (pain score 4-6).   vitamin C 1000 MG tablet Take 1,000 mg by mouth daily.            Durable Medical Equipment  (From admission, onward)         Start     Ordered   05/06/18 1137  DME Walker rolling  Once    Question:  Patient needs a walker to treat with the following condition  Answer:  S/P total knee replacement   05/06/18 1136   05/06/18 1137  DME 3 n 1  Once     05/06/18 1136   05/06/18 1137  DME Bedside commode  Once    Question:  Patient needs a bedside commode to treat with the following condition  Answer:  S/P total knee replacement   05/06/18 1136          FOLLOW UP VISIT:   [facility] Follow up.   Specialty:  [place] Why:  physical therapy Contact information: [address] [place] [address] [phone]           DISPOSITION: HOME VS. SNF  CONDITION:  Good   Donia Ast 05/07/2018, 1:04 PM

## 2018-05-07 NOTE — Progress Notes (Signed)
RN reviewed discharge instructions with patient and family. All questions answered.   Paperwork and prescriptions given.   NT rolled patient down with all belongings to family car. 

## 2018-05-07 NOTE — Op Note (Signed)
TOTAL KNEE REPLACEMENT OPERATIVE NOTE:  05/06/2018  3:45 PM  PATIENT:  Philip Preston  67 y.o. male  PRE-OPERATIVE DIAGNOSIS:  Lt. Knee osteoarthritis  POST-OPERATIVE DIAGNOSIS:  Lt. Knee osteoarthritis  PROCEDURE:  Procedure(s): LEFT TOTAL KNEE ARTHROPLASTY  SURGEON:  Surgeon(s): Vickey Huger, MD  PHYSICIAN ASSISTANT: Carlyon Shadow, PA-C  ANESTHESIA:   spinal  SPECIMEN: None  COUNTS:  Correct  TOURNIQUET:   Total Tourniquet Time Documented: Thigh (Left) - 48 minutes Total: Thigh (Left) - 48 minutes   DICTATION:  Indication for procedure:    The patient is a 67 y.o. male who has failed conservative treatment for Lt. Knee osteoarthritis.  Informed consent was obtained prior to anesthesia. The risks versus benefits of the operation were explain and in a way the patient can, and did, understand.   On the implant demand matching protocol, this patient scored 10.  Therefore, this patient was not receive a polyethylene insert with vitamin E which is a high demand implant.  Description of procedure:     The patient was taken to the operating room and placed under anesthesia.  The patient was positioned in the usual fashion taking care that all body parts were adequately padded and/or protected.  A tourniquet was applied and the leg prepped and draped in the usual sterile fashion.  The extremity was exsanguinated with the esmarch and tourniquet inflated to 350 mmHg.  Pre-operative range of motion was normal.  The knee was in 10 degree of mild varus.  A midline incision approximately 6-7 inches long was made with a #10 blade.  A new blade was used to make a parapatellar arthrotomy going 2-3 cm into the quadriceps tendon, over the patella, and alongside the medial aspect of the patellar tendon.  A synovectomy was then performed with the #10 blade and forceps. I then elevated the deep MCL off the medial tibial metaphysis subperiosteally around to the semimembranosus attachment.     I everted the patella and used calipers to measure patellar thickness.  I used the reamer to ream down to appropriate thickness to recreate the native thickness.  I then removed excess bone with the rongeur and sagittal saw.  I used the appropriately sized template and drilled the three lug holes.  I then put the trial in place and measured the thickness with the calipers to ensure recreation of the native thickness.  The trial was then removed and the patella subluxed and the knee brought into flexion.  A homan retractor was place to retract and protect the patella and lateral structures.  A Z-retractor was place medially to protect the medial structures.  The extra-medullary alignment system was used to make cut the tibial articular surface perpendicular to the anamotic axis of the tibia and in 3 degrees of posterior slope.  The cut surface and alignment jig was removed.  I then used the intramedullary alignment guide to make a 6 valgus cut on the distal femur.  I then marked out the epicondylar axis on the distal femur.  The posterior condylar axis measured 3 degrees.  I then used the anterior referencing sizer and measured the femur to be a size 10.  The 4-In-1 cutting block was screwed into place in external rotation matching the posterior condylar angle, making our cuts perpendicular to the epicondylar axis.  Anterior, posterior and chamfer cuts were made with the sagittal saw.  The cutting block and cut pieces were removed.  A lamina spreader was placed in 90 degrees of flexion.  The ACL, PCL, menisci, and posterior condylar osteophytes were removed.  A 10 mm spacer blocked was found to offer good flexion and extension gap balance after mild in degree releasing.   The scoop retractor was then placed and the femoral finishing block was pinned in place.  The small sagittal saw was used as well as the lug drill to finish the femur.  The block and cut surfaces were removed and the medullary canal hole  filled with autograft bone from the cut pieces.  The tibia was delivered forward in deep flexion and external rotation.  A size G tray was selected and pinned into place centered on the medial 1/3 of the tibial tubercle.  The reamer and keel was used to prepare the tibia through the tray.    I then trialed with the size 10 femur, size G tibia, a 10 mm insert and the 38 patella.  I had excellent flexion/extension gap balance, excellent patella tracking.  Flexion was full and beyond 120 degrees; extension was zero.  These components were chosen and the staff opened them to me on the back table while the knee was lavaged copiously and the cement mixed.  The soft tissue was infiltrated with 60cc of exparel 1.3% through a 21 gauge needle.  I cemented in the components and removed all excess cement.  The polyethylene tibial component was snapped into place and the knee placed in extension while cement was hardening.  The capsule was infilltrated with a 60cc exparel/marcaine/saline mixture.   Once the cement was hard, the tourniquet was let down.  Hemostasis was obtained.  The arthrotomy was closed using a #1 stratofix running suture.  The deep soft tissues were closed with #0 vicryls and the subcuticular layer closed with #2-0 vicryl.  The skin was reapproximated and closed with 3.0 Monocryl.  The wound was covered with steristrips, aquacel dressing, and a TED stocking.   The patient was then awakened, extubated, and taken to the recovery room in stable condition.  BLOOD LOSS:  975OI COMPLICATIONS:  None.  PLAN OF CARE: Admit for overnight observation  PATIENT DISPOSITION:  PACU - hemodynamically stable.   Delay start of Pharmacological VTE agent (>24hrs) due to surgical blood loss or risk of bleeding:  not applicable  Please fax a copy of this op note to my office at [phone] (please only include page 1 and 2 of the Case Information op note)

## 2018-05-07 NOTE — Care Management Note (Signed)
Case Management Note  Patient Details  Name: Philip Preston MRN: [id number] Date of Birth: 12-Jan-1951  Subjective/Objective:     Discharge planning, spoke with patient at bedside. Have chosen [facility] PT, evaluate and treat.  Action/Plan: [facility] for referral. Has DME.  [phone]                Expected Discharge Date:                  Expected Discharge Plan:  [place]  In-House Referral:  NA  Discharge planning Services  CM Consult  Post Acute Care Choice:  Home Health Choice offered to:  Patient  DME Arranged:  N/A DME Agency:  NA  HH Arranged:  PT HH Agency:  [facility]  Status of Service:  Completed, signed off  If discussed at [place] of Stay Meetings, dates discussed:    Additional Comments:  Guadalupe Maple, RN 05/07/2018, 11:09 AM

## 2018-05-08 ENCOUNTER — Encounter (HOSPITAL_COMMUNITY): Payer: Self-pay | Admitting: Orthopedic Surgery

## 2018-05-08 DIAGNOSIS — Z7982 Long term (current) use of aspirin: Secondary | ICD-10-CM | POA: Diagnosis not present

## 2018-05-08 DIAGNOSIS — Z9181 History of falling: Secondary | ICD-10-CM | POA: Diagnosis not present

## 2018-05-08 DIAGNOSIS — Z96652 Presence of left artificial knee joint: Secondary | ICD-10-CM | POA: Diagnosis not present

## 2018-05-08 DIAGNOSIS — Z471 Aftercare following joint replacement surgery: Secondary | ICD-10-CM | POA: Diagnosis not present

## 2018-05-16 DIAGNOSIS — Z96652 Presence of left artificial knee joint: Secondary | ICD-10-CM | POA: Diagnosis not present

## 2018-05-16 DIAGNOSIS — Z471 Aftercare following joint replacement surgery: Secondary | ICD-10-CM | POA: Diagnosis not present

## 2018-05-17 DIAGNOSIS — Z96652 Presence of left artificial knee joint: Secondary | ICD-10-CM | POA: Diagnosis not present

## 2018-05-17 DIAGNOSIS — M62552 Muscle wasting and atrophy, not elsewhere classified, left thigh: Secondary | ICD-10-CM | POA: Diagnosis not present

## 2018-05-17 DIAGNOSIS — R2689 Other abnormalities of gait and mobility: Secondary | ICD-10-CM | POA: Diagnosis not present

## 2018-05-17 DIAGNOSIS — M25462 Effusion, left knee: Secondary | ICD-10-CM | POA: Diagnosis not present

## 2018-05-17 DIAGNOSIS — M25562 Pain in left knee: Secondary | ICD-10-CM | POA: Diagnosis not present

## 2018-05-20 DIAGNOSIS — M25462 Effusion, left knee: Secondary | ICD-10-CM | POA: Diagnosis not present

## 2018-05-20 DIAGNOSIS — M62552 Muscle wasting and atrophy, not elsewhere classified, left thigh: Secondary | ICD-10-CM | POA: Diagnosis not present

## 2018-05-20 DIAGNOSIS — Z96652 Presence of left artificial knee joint: Secondary | ICD-10-CM | POA: Diagnosis not present

## 2018-05-20 DIAGNOSIS — M25562 Pain in left knee: Secondary | ICD-10-CM | POA: Diagnosis not present

## 2018-05-20 DIAGNOSIS — R2689 Other abnormalities of gait and mobility: Secondary | ICD-10-CM | POA: Diagnosis not present

## 2018-05-22 DIAGNOSIS — M25562 Pain in left knee: Secondary | ICD-10-CM | POA: Diagnosis not present

## 2018-05-22 DIAGNOSIS — R2689 Other abnormalities of gait and mobility: Secondary | ICD-10-CM | POA: Diagnosis not present

## 2018-05-22 DIAGNOSIS — Z96652 Presence of left artificial knee joint: Secondary | ICD-10-CM | POA: Diagnosis not present

## 2018-05-22 DIAGNOSIS — M62552 Muscle wasting and atrophy, not elsewhere classified, left thigh: Secondary | ICD-10-CM | POA: Diagnosis not present

## 2018-05-22 DIAGNOSIS — M25462 Effusion, left knee: Secondary | ICD-10-CM | POA: Diagnosis not present

## 2018-05-24 DIAGNOSIS — R2689 Other abnormalities of gait and mobility: Secondary | ICD-10-CM | POA: Diagnosis not present

## 2018-05-24 DIAGNOSIS — M62552 Muscle wasting and atrophy, not elsewhere classified, left thigh: Secondary | ICD-10-CM | POA: Diagnosis not present

## 2018-05-24 DIAGNOSIS — Z96652 Presence of left artificial knee joint: Secondary | ICD-10-CM | POA: Diagnosis not present

## 2018-05-24 DIAGNOSIS — M25462 Effusion, left knee: Secondary | ICD-10-CM | POA: Diagnosis not present

## 2018-05-24 DIAGNOSIS — M25562 Pain in left knee: Secondary | ICD-10-CM | POA: Diagnosis not present

## 2018-05-27 DIAGNOSIS — M25462 Effusion, left knee: Secondary | ICD-10-CM | POA: Diagnosis not present

## 2018-05-27 DIAGNOSIS — M62552 Muscle wasting and atrophy, not elsewhere classified, left thigh: Secondary | ICD-10-CM | POA: Diagnosis not present

## 2018-05-27 DIAGNOSIS — R2689 Other abnormalities of gait and mobility: Secondary | ICD-10-CM | POA: Diagnosis not present

## 2018-05-27 DIAGNOSIS — M25562 Pain in left knee: Secondary | ICD-10-CM | POA: Diagnosis not present

## 2018-05-27 DIAGNOSIS — Z96652 Presence of left artificial knee joint: Secondary | ICD-10-CM | POA: Diagnosis not present

## 2018-05-29 DIAGNOSIS — R2689 Other abnormalities of gait and mobility: Secondary | ICD-10-CM | POA: Diagnosis not present

## 2018-05-29 DIAGNOSIS — Z96652 Presence of left artificial knee joint: Secondary | ICD-10-CM | POA: Diagnosis not present

## 2018-05-29 DIAGNOSIS — M62552 Muscle wasting and atrophy, not elsewhere classified, left thigh: Secondary | ICD-10-CM | POA: Diagnosis not present

## 2018-05-29 DIAGNOSIS — M25562 Pain in left knee: Secondary | ICD-10-CM | POA: Diagnosis not present

## 2018-05-29 DIAGNOSIS — M25462 Effusion, left knee: Secondary | ICD-10-CM | POA: Diagnosis not present

## 2018-05-31 DIAGNOSIS — M25462 Effusion, left knee: Secondary | ICD-10-CM | POA: Diagnosis not present

## 2018-05-31 DIAGNOSIS — M62552 Muscle wasting and atrophy, not elsewhere classified, left thigh: Secondary | ICD-10-CM | POA: Diagnosis not present

## 2018-05-31 DIAGNOSIS — M25562 Pain in left knee: Secondary | ICD-10-CM | POA: Diagnosis not present

## 2018-05-31 DIAGNOSIS — R2689 Other abnormalities of gait and mobility: Secondary | ICD-10-CM | POA: Diagnosis not present

## 2018-05-31 DIAGNOSIS — Z96652 Presence of left artificial knee joint: Secondary | ICD-10-CM | POA: Diagnosis not present

## 2018-06-03 DIAGNOSIS — Z96652 Presence of left artificial knee joint: Secondary | ICD-10-CM | POA: Diagnosis not present

## 2018-06-03 DIAGNOSIS — M62552 Muscle wasting and atrophy, not elsewhere classified, left thigh: Secondary | ICD-10-CM | POA: Diagnosis not present

## 2018-06-03 DIAGNOSIS — M25562 Pain in left knee: Secondary | ICD-10-CM | POA: Diagnosis not present

## 2018-06-03 DIAGNOSIS — M25462 Effusion, left knee: Secondary | ICD-10-CM | POA: Diagnosis not present

## 2018-06-03 DIAGNOSIS — R2689 Other abnormalities of gait and mobility: Secondary | ICD-10-CM | POA: Diagnosis not present

## 2018-06-05 DIAGNOSIS — M25462 Effusion, left knee: Secondary | ICD-10-CM | POA: Diagnosis not present

## 2018-06-05 DIAGNOSIS — Z96652 Presence of left artificial knee joint: Secondary | ICD-10-CM | POA: Diagnosis not present

## 2018-06-05 DIAGNOSIS — R2689 Other abnormalities of gait and mobility: Secondary | ICD-10-CM | POA: Diagnosis not present

## 2018-06-05 DIAGNOSIS — M62552 Muscle wasting and atrophy, not elsewhere classified, left thigh: Secondary | ICD-10-CM | POA: Diagnosis not present

## 2018-06-05 DIAGNOSIS — M25562 Pain in left knee: Secondary | ICD-10-CM | POA: Diagnosis not present

## 2018-06-07 DIAGNOSIS — R2689 Other abnormalities of gait and mobility: Secondary | ICD-10-CM | POA: Diagnosis not present

## 2018-06-07 DIAGNOSIS — Z96652 Presence of left artificial knee joint: Secondary | ICD-10-CM | POA: Diagnosis not present

## 2018-06-07 DIAGNOSIS — M62552 Muscle wasting and atrophy, not elsewhere classified, left thigh: Secondary | ICD-10-CM | POA: Diagnosis not present

## 2018-06-07 DIAGNOSIS — M25562 Pain in left knee: Secondary | ICD-10-CM | POA: Diagnosis not present

## 2018-06-07 DIAGNOSIS — M25462 Effusion, left knee: Secondary | ICD-10-CM | POA: Diagnosis not present

## 2018-06-10 DIAGNOSIS — M25562 Pain in left knee: Secondary | ICD-10-CM | POA: Diagnosis not present

## 2018-06-10 DIAGNOSIS — Z96652 Presence of left artificial knee joint: Secondary | ICD-10-CM | POA: Diagnosis not present

## 2018-06-10 DIAGNOSIS — M25462 Effusion, left knee: Secondary | ICD-10-CM | POA: Diagnosis not present

## 2018-06-10 DIAGNOSIS — R2689 Other abnormalities of gait and mobility: Secondary | ICD-10-CM | POA: Diagnosis not present

## 2018-06-10 DIAGNOSIS — M62552 Muscle wasting and atrophy, not elsewhere classified, left thigh: Secondary | ICD-10-CM | POA: Diagnosis not present

## 2018-06-13 DIAGNOSIS — M25462 Effusion, left knee: Secondary | ICD-10-CM | POA: Diagnosis not present

## 2018-06-13 DIAGNOSIS — R2689 Other abnormalities of gait and mobility: Secondary | ICD-10-CM | POA: Diagnosis not present

## 2018-06-13 DIAGNOSIS — Z96652 Presence of left artificial knee joint: Secondary | ICD-10-CM | POA: Diagnosis not present

## 2018-06-13 DIAGNOSIS — M25562 Pain in left knee: Secondary | ICD-10-CM | POA: Diagnosis not present

## 2018-06-13 DIAGNOSIS — M62552 Muscle wasting and atrophy, not elsewhere classified, left thigh: Secondary | ICD-10-CM | POA: Diagnosis not present

## 2018-06-21 DIAGNOSIS — E782 Mixed hyperlipidemia: Secondary | ICD-10-CM | POA: Diagnosis not present

## 2018-06-21 DIAGNOSIS — N529 Male erectile dysfunction, unspecified: Secondary | ICD-10-CM | POA: Diagnosis not present

## 2018-06-21 DIAGNOSIS — M150 Primary generalized (osteo)arthritis: Secondary | ICD-10-CM | POA: Diagnosis not present

## 2018-06-21 DIAGNOSIS — Z125 Encounter for screening for malignant neoplasm of prostate: Secondary | ICD-10-CM | POA: Diagnosis not present

## 2018-06-21 DIAGNOSIS — Z0000 Encounter for general adult medical examination without abnormal findings: Secondary | ICD-10-CM | POA: Diagnosis not present

## 2018-06-21 DIAGNOSIS — Z23 Encounter for immunization: Secondary | ICD-10-CM | POA: Diagnosis not present

## 2018-07-15 DIAGNOSIS — M9902 Segmental and somatic dysfunction of thoracic region: Secondary | ICD-10-CM | POA: Diagnosis not present

## 2018-07-15 DIAGNOSIS — S29019A Strain of muscle and tendon of unspecified wall of thorax, initial encounter: Secondary | ICD-10-CM | POA: Diagnosis not present

## 2018-07-15 DIAGNOSIS — M9903 Segmental and somatic dysfunction of lumbar region: Secondary | ICD-10-CM | POA: Diagnosis not present

## 2018-07-15 DIAGNOSIS — S233XXA Sprain of ligaments of thoracic spine, initial encounter: Secondary | ICD-10-CM | POA: Diagnosis not present

## 2018-07-15 DIAGNOSIS — M4012 Other secondary kyphosis, cervical region: Secondary | ICD-10-CM | POA: Diagnosis not present

## 2018-07-15 DIAGNOSIS — M5383 Other specified dorsopathies, cervicothoracic region: Secondary | ICD-10-CM | POA: Diagnosis not present

## 2018-07-15 DIAGNOSIS — M9901 Segmental and somatic dysfunction of cervical region: Secondary | ICD-10-CM | POA: Diagnosis not present

## 2018-07-16 DIAGNOSIS — M9902 Segmental and somatic dysfunction of thoracic region: Secondary | ICD-10-CM | POA: Diagnosis not present

## 2018-07-16 DIAGNOSIS — D649 Anemia, unspecified: Secondary | ICD-10-CM | POA: Diagnosis not present

## 2018-07-16 DIAGNOSIS — M9901 Segmental and somatic dysfunction of cervical region: Secondary | ICD-10-CM | POA: Diagnosis not present

## 2018-07-16 DIAGNOSIS — S29019A Strain of muscle and tendon of unspecified wall of thorax, initial encounter: Secondary | ICD-10-CM | POA: Diagnosis not present

## 2018-07-16 DIAGNOSIS — M5383 Other specified dorsopathies, cervicothoracic region: Secondary | ICD-10-CM | POA: Diagnosis not present

## 2018-07-16 DIAGNOSIS — S233XXA Sprain of ligaments of thoracic spine, initial encounter: Secondary | ICD-10-CM | POA: Diagnosis not present

## 2018-07-16 DIAGNOSIS — M4012 Other secondary kyphosis, cervical region: Secondary | ICD-10-CM | POA: Diagnosis not present

## 2018-07-16 DIAGNOSIS — M9903 Segmental and somatic dysfunction of lumbar region: Secondary | ICD-10-CM | POA: Diagnosis not present

## 2018-07-18 DIAGNOSIS — M9903 Segmental and somatic dysfunction of lumbar region: Secondary | ICD-10-CM | POA: Diagnosis not present

## 2018-07-18 DIAGNOSIS — M5383 Other specified dorsopathies, cervicothoracic region: Secondary | ICD-10-CM | POA: Diagnosis not present

## 2018-07-18 DIAGNOSIS — M9902 Segmental and somatic dysfunction of thoracic region: Secondary | ICD-10-CM | POA: Diagnosis not present

## 2018-07-18 DIAGNOSIS — M4012 Other secondary kyphosis, cervical region: Secondary | ICD-10-CM | POA: Diagnosis not present

## 2018-07-18 DIAGNOSIS — S233XXA Sprain of ligaments of thoracic spine, initial encounter: Secondary | ICD-10-CM | POA: Diagnosis not present

## 2018-07-18 DIAGNOSIS — M9901 Segmental and somatic dysfunction of cervical region: Secondary | ICD-10-CM | POA: Diagnosis not present

## 2018-07-18 DIAGNOSIS — S29019A Strain of muscle and tendon of unspecified wall of thorax, initial encounter: Secondary | ICD-10-CM | POA: Diagnosis not present

## 2018-07-22 DIAGNOSIS — M5383 Other specified dorsopathies, cervicothoracic region: Secondary | ICD-10-CM | POA: Diagnosis not present

## 2018-07-22 DIAGNOSIS — M4012 Other secondary kyphosis, cervical region: Secondary | ICD-10-CM | POA: Diagnosis not present

## 2018-07-22 DIAGNOSIS — M9901 Segmental and somatic dysfunction of cervical region: Secondary | ICD-10-CM | POA: Diagnosis not present

## 2018-07-22 DIAGNOSIS — M9903 Segmental and somatic dysfunction of lumbar region: Secondary | ICD-10-CM | POA: Diagnosis not present

## 2018-07-22 DIAGNOSIS — S233XXA Sprain of ligaments of thoracic spine, initial encounter: Secondary | ICD-10-CM | POA: Diagnosis not present

## 2018-07-22 DIAGNOSIS — S29019A Strain of muscle and tendon of unspecified wall of thorax, initial encounter: Secondary | ICD-10-CM | POA: Diagnosis not present

## 2018-07-22 DIAGNOSIS — M9902 Segmental and somatic dysfunction of thoracic region: Secondary | ICD-10-CM | POA: Diagnosis not present

## 2018-08-13 DIAGNOSIS — D649 Anemia, unspecified: Secondary | ICD-10-CM | POA: Diagnosis not present

## 2018-09-05 DIAGNOSIS — J208 Acute bronchitis due to other specified organisms: Secondary | ICD-10-CM | POA: Diagnosis not present

## 2018-09-05 DIAGNOSIS — B9689 Other specified bacterial agents as the cause of diseases classified elsewhere: Secondary | ICD-10-CM | POA: Diagnosis not present

## 2018-10-15 DIAGNOSIS — M25561 Pain in right knee: Secondary | ICD-10-CM | POA: Diagnosis not present

## 2018-10-15 DIAGNOSIS — G8929 Other chronic pain: Secondary | ICD-10-CM | POA: Diagnosis not present

## 2018-10-15 DIAGNOSIS — M1711 Unilateral primary osteoarthritis, right knee: Secondary | ICD-10-CM | POA: Diagnosis not present

## 2018-10-17 DIAGNOSIS — Z01818 Encounter for other preprocedural examination: Secondary | ICD-10-CM | POA: Diagnosis not present

## 2018-10-17 NOTE — Patient Instructions (Signed)
Philip Preston  10/17/2018       Your procedure is scheduled on:  10-28-2018   Report to [facility] Main  Entrance,  Report to admitting at  5:30 AM    Call this number if you have problems the morning of surgery [phone]        Remember: Do not eat food or drink liquids :After Midnight.  This includes no water, candy, gum, mints   BRUSH YOUR TEETH MORNING OF SURGERY AND RINSE YOUR MOUTH OUT         Take these medicines the morning of surgery with A SIP OF WATER:   NONE                                    You may not have any metal on your body including piercings              Do not wear jewelry, lotions, powders or perfumes, deodorant                          Men may shave face and neck.       Do not bring valuables to the hospital. [place].  Contacts, dentures or bridgework may not be worn into surgery.  Leave suitcase in the car. After surgery it may be brought to your room.     _____________________________________________________________________             [facility] - Preparing for Surgery Before surgery, you can play an important role.  Because skin is not sterile, your skin needs to be as free of germs as possible.  You can reduce the number of germs on your skin by washing with CHG (chlorahexidine gluconate) soap before surgery.  CHG is an antiseptic cleaner which kills germs and bonds with the skin to continue killing germs even after washing. Please DO NOT use if you have an allergy to CHG or antibacterial soaps.  If your skin becomes reddened/irritated stop using the CHG and inform your nurse when you arrive at Short Stay. Do not shave (including legs and underarms) for at least 48 hours prior to the first CHG shower.  You may shave your face/neck. Please follow these instructions carefully:  1.  Shower with CHG Soap the night before surgery and the  morning  of Surgery.  2.  If you choose to wash your hair, wash your hair first as usual with your  normal  shampoo.  3.  After you shampoo, rinse your hair and body thoroughly to remove the  shampoo.                            4.  Use CHG as you would any other liquid soap.  You can apply chg directly  to the skin and wash                       Gently with a scrungie or clean washcloth.  5.  Apply the CHG Soap to your body ONLY FROM THE NECK DOWN.   Do not use on face/ open  Wound or open sores. Avoid contact with eyes, ears mouth and genitals (private parts).                       Wash face,  Genitals (private parts) with your normal soap.             6.  Wash thoroughly, paying special attention to the area where your surgery  will be performed.  7.  Thoroughly rinse your body with warm water from the neck down.  8.  DO NOT shower/wash with your normal soap after using and rinsing off  the CHG Soap.             9.  Pat yourself dry with a clean towel.            10.  Wear clean pajamas.            11.  Place clean sheets on your bed the night of your first shower and do not  sleep with pets. Day of Surgery : Do not apply any lotions/deodorants the morning of surgery.  Please wear clean clothes to the hospital/surgery center.  FAILURE TO FOLLOW THESE INSTRUCTIONS MAY RESULT IN THE CANCELLATION OF YOUR SURGERY PATIENT SIGNATURE_________________________________  NURSE SIGNATURE__________________________________  ________________________________________________________________________   Philip Preston  An incentive spirometer is a tool that can help keep your lungs clear and active. This tool measures how well you are filling your lungs with each breath. Taking long deep breaths may help reverse or decrease the chance of developing breathing (pulmonary) problems (especially infection) following:  A long period of time when you are unable to move or be active. BEFORE  THE PROCEDURE   If the spirometer includes an indicator to show your best effort, your nurse or respiratory therapist will set it to a desired goal.  If possible, sit up straight or lean slightly forward. Try not to slouch.  Hold the incentive spirometer in an upright position. INSTRUCTIONS FOR USE  1. Sit on the edge of your bed if possible, or sit up as far as you can in bed or on a chair. 2. Hold the incentive spirometer in an upright position. 3. Breathe out normally. 4. Place the mouthpiece in your mouth and seal your lips tightly around it. 5. Breathe in slowly and as deeply as possible, raising the piston or the ball toward the top of the column. 6. Hold your breath for 3-5 seconds or for as long as possible. Allow the piston or ball to fall to the bottom of the column. 7. Remove the mouthpiece from your mouth and breathe out normally. 8. Rest for a few seconds and repeat Steps 1 through 7 at least 10 times every 1-2 hours when you are awake. Take your time and take a few normal breaths between deep breaths. 9. The spirometer may include an indicator to show your best effort. Use the indicator as a goal to work toward during each repetition. 10. After each set of 10 deep breaths, practice coughing to be sure your lungs are clear. If you have an incision (the cut made at the time of surgery), support your incision when coughing by placing a pillow or rolled up towels firmly against it. Once you are able to get out of bed, walk around indoors and cough well. You may stop using the incentive spirometer when instructed by your caregiver.  RISKS AND COMPLICATIONS  Take your time so you do not get dizzy or light-headed.  If you are in pain, you may need to take or ask for pain medication before doing incentive spirometry. It is harder to take a deep breath if you are having pain. AFTER USE  Rest and breathe slowly and easily.  It can be helpful to keep track of a log of your progress.  Your caregiver can provide you with a simple table to help with this. If you are using the spirometer at home, follow these instructions: [name] IF:   You are having difficultly using the spirometer.  You have trouble using the spirometer as often as instructed.  Your pain medication is not giving enough relief while using the spirometer.  You develop fever of 100.5 F (38.1 C) or higher. SEEK IMMEDIATE MEDICAL CARE IF:   You cough up bloody sputum that had not been present before.  You develop fever of 102 F (38.9 C) or greater.  You develop worsening pain at or near the incision site. MAKE SURE YOU:   Understand these instructions.  Will watch your condition.  Will get help right away if you are not doing well or get worse. Document Released: 12/04/2006 Document Revised: 10/16/2011 Document Reviewed: 02/04/2007 [facility] Patient Information 2014 [place], [place].   ________________________________________________________________________

## 2018-10-21 ENCOUNTER — Other Ambulatory Visit: Payer: Self-pay | Admitting: Orthopedic Surgery

## 2018-10-22 ENCOUNTER — Encounter (HOSPITAL_COMMUNITY): Payer: Self-pay

## 2018-10-22 ENCOUNTER — Other Ambulatory Visit: Payer: Self-pay

## 2018-10-22 ENCOUNTER — Encounter (HOSPITAL_COMMUNITY)
Admission: RE | Admit: 2018-10-22 | Discharge: 2018-10-22 | Disposition: A | Discharge status: Home / Self Care | Payer: PPO | Source: Ambulatory Visit | Attending: Orthopedic Surgery | Admitting: Orthopedic Surgery

## 2018-10-22 DIAGNOSIS — Z01812 Encounter for preprocedural laboratory examination: Secondary | ICD-10-CM | POA: Insufficient documentation

## 2018-10-22 HISTORY — DX: Male erectile dysfunction, unspecified: N52.9

## 2018-10-22 HISTORY — DX: Benign prostatic hyperplasia with lower urinary tract symptoms: N40.1

## 2018-10-22 HISTORY — DX: Unspecified osteoarthritis, unspecified site: M19.90

## 2018-10-22 HISTORY — DX: Presence of spectacles and contact lenses: Z97.3

## 2018-10-22 HISTORY — DX: Nocturia: R35.1

## 2018-10-22 LAB — COMPREHENSIVE METABOLIC PANEL
ALT: 20 U/L (ref 0–44)
AST: 31 U/L (ref 15–41)
Albumin: 4.4 g/dL (ref 3.5–5.0)
Alkaline Phosphatase: 65 U/L (ref 38–126)
Anion gap: 7 (ref 5–15)
BUN: 22 mg/dL (ref 8–23)
CO2: 28 mmol/L (ref 22–32)
Calcium: 9.1 mg/dL (ref 8.9–10.3)
Chloride: 100 mmol/L (ref 98–111)
Creatinine, Ser: 0.98 mg/dL (ref 0.61–1.24)
GFR calc Af Amer: >60 mL/min (ref >60)
GFR calc non Af Amer: >60 mL/min (ref >60)
Glucose, Bld: 91 mg/dL (ref 70–99)
POTASSIUM: 4.1 mmol/L (ref 3.5–5.1)
Sodium: 135 mmol/L (ref 135–145)
Total Bilirubin: 0.8 mg/dL (ref 0.3–1.2)
Total Protein: 7.2 g/dL (ref 6.5–8.1)

## 2018-10-22 LAB — CBC WITH DIFFERENTIAL/PLATELET
Abs Immature Granulocytes: 0.01 10*3/uL (ref 0.00–0.07)
Basophils Absolute: 0 10*3/uL (ref 0.0–0.1)
Basophils Relative: 1 %
EOS ABS: 0.2 10*3/uL (ref 0.0–0.5)
Eosinophils Relative: 4 %
HCT: 43.4 % (ref 39.0–52.0)
Hemoglobin: 13.8 g/dL (ref 13.0–17.0)
Immature Granulocytes: 0 %
Lymphocytes Relative: 24 %
Lymphs Abs: 0.9 10*3/uL (ref 0.7–4.0)
MCH: 30.1 pg (ref 26.0–34.0)
MCHC: 31.8 g/dL (ref 30.0–36.0)
MCV: 94.8 fL (ref 80.0–100.0)
Monocytes Absolute: 0.5 10*3/uL (ref 0.1–1.0)
Monocytes Relative: 12 %
NRBC: 0 % (ref 0.0–0.2)
Neutro Abs: 2.3 10*3/uL (ref 1.7–7.7)
Neutrophils Relative %: 59 %
Platelets: 223 10*3/uL (ref 150–400)
RBC: 4.58 MIL/uL (ref 4.22–5.81)
RDW: 13.2 % (ref 11.5–15.5)
WBC: 4 10*3/uL (ref 4.0–10.5)

## 2018-10-22 LAB — SURGICAL PCR SCREEN
MRSA, PCR: NEGATIVE
Staphylococcus aureus: NEGATIVE

## 2018-10-22 NOTE — Progress Notes (Addendum)
Requested and received pt 12 ekg tracing done at pt's pcp , dr Bea Graff, dated 03-12-2018 placed in chart.   Pt pcp , dr Bea Graff, [facility] / pre-op clearance dated 10-17-2018 in epic.

## 2018-12-10 ENCOUNTER — Other Ambulatory Visit: Payer: Self-pay | Admitting: Orthopedic Surgery

## 2018-12-11 ENCOUNTER — Other Ambulatory Visit (HOSPITAL_COMMUNITY): Payer: Self-pay | Admitting: *Deleted

## 2018-12-11 ENCOUNTER — Other Ambulatory Visit: Payer: Self-pay | Admitting: Orthopedic Surgery

## 2018-12-11 NOTE — Patient Instructions (Addendum)
Chrles Selley  12/11/2018       Your procedure is scheduled on:  12-16-2018   Report to [facility] Main  Entrance,  Report to [place] at 5:30 AM               Call this number if you have problems the morning of surgery [phone]    GO TO [facility] ENTRANCE AFTER YOUR PRE OP VISIT TODAY BETWEEN THE HOURS OF 11:00 AM AND 3:00 PM FOR YOUR DRIVE UP COVID 19 TEST.     Remember: NO SOLID FOOD AFTER MIDNIGHT THE NIGHT PRIOR TO SURGERY. NOTHING BY MOUTH EXCEPT CLEAR LIQUIDS UNTIL 4:30 AM PRIOR TO [place].  PLEASE FINISH ENSURE PRE-SURGERY DRINK PER SURGEON ORDER , WHICH NEEDS TO BE COMPLETED AT 4:30 AM.  NOTHING BY MOUTH AFTER 4:30 AM INCLUDING WATER, CANDY, GUM, MINTS.   BRUSH YOUR TEETH MORNING OF SURGERY AND RINSE YOUR MOUTH OUT    CLEAR LIQUID DIET   Foods Allowed                                                                     Foods Excluded  Coffee and tea, regular and decaf                             liquids that you cannot  Plain Jell-O in any flavor                                             see through such as: Fruit ices (not with fruit pulp)                                     milk, soups, orange juice  Iced Popsicles                                    All solid food Carbonated beverages, regular and diet                                    Cranberry, grape and apple juices Sports drinks like Gatorade Lightly seasoned clear broth or consume(fat free) Sugar, honey syrup  _____________________________________________________________________    Take these medicines the morning of surgery with A SIP OF WATER:   NONE                                 You may not have any metal on your body including piercings              Do not wear jewelry,  lotions, powders or perfumes, deodorant  Men may shave face and neck.      Do not bring valuables to the hospital. [place].  Contacts, dentures or bridgework may not be worn into surgery.   _____________________________________________________________________             [facility] - Preparing for Surgery Before surgery, you can play an important role.  Because skin is not sterile, your skin needs to be as free of germs as possible.  You can reduce the number of germs on your skin by washing with CHG (chlorahexidine gluconate) soap before surgery.  CHG is an antiseptic cleaner which kills germs and bonds with the skin to continue killing germs even after washing. Please DO NOT use if you have an allergy to CHG or antibacterial soaps.  If your skin becomes reddened/irritated stop using the CHG and inform your nurse when you arrive at Short Stay. Do not shave (including legs and underarms) for at least 48 hours prior to the first CHG shower.  You may shave your face/neck. Please follow these instructions carefully:  1.  Shower with CHG Soap the night before surgery and the  morning of Surgery.  2.  If you choose to wash your hair, wash your hair first as usual with your  normal  shampoo.  3.  After you shampoo, rinse your hair and body thoroughly to remove the  shampoo.                            4.  Use CHG as you would any other liquid soap.  You can apply chg directly  to the skin and wash                       Gently with a scrungie or clean washcloth.  5.  Apply the CHG Soap to your body ONLY FROM THE NECK DOWN.   Do not use on face/ open                           Wound or open sores. Avoid contact with eyes, ears mouth and genitals (private parts).                       Wash face,  Genitals (private parts) with your normal soap.             6.  Wash thoroughly, paying special attention to the area where your surgery  will be performed.  7.  Thoroughly rinse your body with warm water from the neck down.  8.  DO NOT shower/wash with your normal soap after using and  rinsing off  the CHG Soap.             9.  Pat yourself dry with a clean towel.            10.  Wear clean pajamas.            11.  Place clean sheets on your bed the night of your first shower and do not  sleep with pets. Day of Surgery : Do not apply any lotions/deodorants the morning of surgery.  Please wear clean clothes to the hospital/surgery center.  FAILURE TO FOLLOW THESE INSTRUCTIONS MAY RESULT IN THE CANCELLATION OF YOUR SURGERY PATIENT  SIGNATURE_________________________________  NURSE SIGNATURE__________________________________  ________________________________________________________________________   Adam Phenix  An incentive spirometer is a tool that can help keep your lungs clear and active. This tool measures how well you are filling your lungs with each breath. Taking long deep breaths may help reverse or decrease the chance of developing breathing (pulmonary) problems (especially infection) following:  A long period of time when you are unable to move or be active. BEFORE THE PROCEDURE   If the spirometer includes an indicator to show your best effort, your nurse or respiratory therapist will set it to a desired goal.  If possible, sit up straight or lean slightly forward. Try not to slouch.  Hold the incentive spirometer in an upright position. INSTRUCTIONS FOR USE  1. Sit on the edge of your bed if possible, or sit up as far as you can in bed or on a chair. 2. Hold the incentive spirometer in an upright position. 3. Breathe out normally. 4. Place the mouthpiece in your mouth and seal your lips tightly around it. 5. Breathe in slowly and as deeply as possible, raising the piston or the ball toward the top of the column. 6. Hold your breath for 3-5 seconds or for as long as possible. Allow the piston or ball to fall to the bottom of the column. 7. Remove the mouthpiece from your mouth and breathe out normally. 8. Rest for a few seconds and repeat Steps 1  through 7 at least 10 times every 1-2 hours when you are awake. Take your time and take a few normal breaths between deep breaths. 9. The spirometer may include an indicator to show your best effort. Use the indicator as a goal to work toward during each repetition. 10. After each set of 10 deep breaths, practice coughing to be sure your lungs are clear. If you have an incision (the cut made at the time of surgery), support your incision when coughing by placing a pillow or rolled up towels firmly against it. Once you are able to get out of bed, walk around indoors and cough well. You may stop using the incentive spirometer when instructed by your caregiver.  RISKS AND COMPLICATIONS  Take your time so you do not get dizzy or light-headed.  If you are in pain, you may need to take or ask for pain medication before doing incentive spirometry. It is harder to take a deep breath if you are having pain. AFTER USE  Rest and breathe slowly and easily.  It can be helpful to keep track of a log of your progress. Your caregiver can provide you with a simple table to help with this. If you are using the spirometer at home, follow these instructions: [place] IF:   You are having difficultly using the spirometer.  You have trouble using the spirometer as often as instructed.  Your pain medication is not giving enough relief while using the spirometer.  You develop fever of 100.5 F (38.1 C) or higher. SEEK IMMEDIATE MEDICAL CARE IF:   You cough up bloody sputum that had not been present before.  You develop fever of 102 F (38.9 C) or greater.  You develop worsening pain at or near the incision site. MAKE SURE YOU:   Understand these instructions.  Will watch your condition.  Will get help right away if you are not doing well or get worse. Document Released: 12/04/2006 Document Revised: 10/16/2011 Document Reviewed: 02/04/2007 [facility] Patient Information 2014 [place],  [place].  ________________________________________________________________________  

## 2018-12-12 ENCOUNTER — Other Ambulatory Visit: Payer: Self-pay | Admitting: Orthopedic Surgery

## 2018-12-12 ENCOUNTER — Other Ambulatory Visit: Payer: Self-pay

## 2018-12-12 ENCOUNTER — Other Ambulatory Visit (HOSPITAL_COMMUNITY)
Admission: RE | Admit: 2018-12-12 | Discharge: 2018-12-12 | Disposition: A | Discharge status: Home / Self Care | Payer: PPO | Source: Ambulatory Visit | Attending: Orthopedic Surgery | Admitting: Orthopedic Surgery

## 2018-12-12 ENCOUNTER — Encounter (HOSPITAL_COMMUNITY)
Admission: RE | Admit: 2018-12-12 | Discharge: 2018-12-12 | Disposition: A | Discharge status: Home / Self Care | Payer: PPO | Source: Ambulatory Visit | Attending: Orthopedic Surgery | Admitting: Orthopedic Surgery

## 2018-12-12 ENCOUNTER — Encounter (HOSPITAL_COMMUNITY): Payer: Self-pay

## 2018-12-12 DIAGNOSIS — Z01812 Encounter for preprocedural laboratory examination: Secondary | ICD-10-CM | POA: Insufficient documentation

## 2018-12-12 DIAGNOSIS — M1711 Unilateral primary osteoarthritis, right knee: Secondary | ICD-10-CM | POA: Diagnosis not present

## 2018-12-12 DIAGNOSIS — Z1159 Encounter for screening for other viral diseases: Secondary | ICD-10-CM | POA: Insufficient documentation

## 2018-12-12 LAB — COMPREHENSIVE METABOLIC PANEL
ALT: 21 U/L (ref 0–44)
AST: 30 U/L (ref 15–41)
Albumin: 4.2 g/dL (ref 3.5–5.0)
Alkaline Phosphatase: 60 U/L (ref 38–126)
Anion gap: 9 (ref 5–15)
BUN: 21 mg/dL (ref 8–23)
CO2: 27 mmol/L (ref 22–32)
Calcium: 9.2 mg/dL (ref 8.9–10.3)
Chloride: 100 mmol/L (ref 98–111)
Creatinine, Ser: 0.93 mg/dL (ref 0.61–1.24)
GFR calc Af Amer: >60 mL/min (ref >60)
GFR calc non Af Amer: >60 mL/min (ref >60)
Glucose, Bld: 93 mg/dL (ref 70–99)
Potassium: 4.6 mmol/L (ref 3.5–5.1)
Sodium: 136 mmol/L (ref 135–145)
Total Bilirubin: 0.9 mg/dL (ref 0.3–1.2)
Total Protein: 6.8 g/dL (ref 6.5–8.1)

## 2018-12-12 LAB — CBC WITH DIFFERENTIAL/PLATELET
Abs Immature Granulocytes: 0.01 10*3/uL (ref 0.00–0.07)
Basophils Absolute: 0.1 10*3/uL (ref 0.0–0.1)
Basophils Relative: 1 %
Eosinophils Absolute: 0.2 10*3/uL (ref 0.0–0.5)
Eosinophils Relative: 4 %
HCT: 41.5 % (ref 39.0–52.0)
Hemoglobin: 13.6 g/dL (ref 13.0–17.0)
Immature Granulocytes: 0 %
Lymphocytes Relative: 25 %
Lymphs Abs: 1 10*3/uL (ref 0.7–4.0)
MCH: 30.8 pg (ref 26.0–34.0)
MCHC: 32.8 g/dL (ref 30.0–36.0)
MCV: 93.9 fL (ref 80.0–100.0)
Monocytes Absolute: 0.5 10*3/uL (ref 0.1–1.0)
Monocytes Relative: 11 %
Neutro Abs: 2.4 10*3/uL (ref 1.7–7.7)
Neutrophils Relative %: 59 %
Platelets: 199 10*3/uL (ref 150–400)
RBC: 4.42 MIL/uL (ref 4.22–5.81)
RDW: 12.3 % (ref 11.5–15.5)
WBC: 4.1 10*3/uL (ref 4.0–10.5)
nRBC: 0 % (ref 0.0–0.2)

## 2018-12-12 LAB — SURGICAL PCR SCREEN
MRSA, PCR: NEGATIVE
Staphylococcus aureus: NEGATIVE

## 2018-12-12 NOTE — Progress Notes (Signed)
SPOKE W/  _  Pt at pat appointment     SCREENING SYMPTOMS OF COVID 19:   COUGH-- NO  RUNNY NOSE---  No  SORE THROAT--- No  NASAL CONGESTION---- NO  SNEEZING---- NO  SHORTNESS OF BREATH--- NO  DIFFICULTY BREATHING--- No  TEMP >100.0 ----- NO  UNEXPLAINED BODY ACHES------ NO  CHILLS -------- NO  HEADACHES --------- NO  LOSS OF SMELL/ TASTE -------- NO    HAVE YOU OR ANY FAMILY MEMBER TRAVELLED PAST 14 DAYS OUT OF THE   COUNTY--- No STATE---- No COUNTRY---- NO  HAVE YOU OR ANY FAMILY MEMBER BEEN EXPOSED TO ANYONE WITH COVID 19?   Denies.

## 2018-12-12 NOTE — Anesthesia Preprocedure Evaluation (Addendum)
Anesthesia Evaluation  Patient identified by MRN, date of birth, ID band Patient awake    Reviewed: Allergy & Precautions, NPO status , Patient's Chart, lab work & pertinent test results  Airway Mallampati: II  TM Distance: >3 FB     Dental   Pulmonary neg pulmonary ROS,    breath sounds clear to auscultation       Cardiovascular negative cardio ROS   Rhythm:Regular Rate:Normal     Neuro/Psych negative neurological ROS     GI/Hepatic negative GI ROS, Neg liver ROS,   Endo/Other  negative endocrine ROS  Renal/GU negative Renal ROS     Musculoskeletal  (+) Arthritis ,   Abdominal   Peds  Hematology negative hematology ROS (+)   Anesthesia Other Findings   Reproductive/Obstetrics                            Lab Results  Component Value Date   WBC 4.1 12/12/2018   HGB 13.6 12/12/2018   HCT 41.5 12/12/2018   MCV 93.9 12/12/2018   PLT 199 12/12/2018   Lab Results  Component Value Date   CREATININE 0.93 12/12/2018   BUN 21 12/12/2018   NA 136 12/12/2018   K 4.6 12/12/2018   CL 100 12/12/2018   CO2 27 12/12/2018   No results found for: INR, PROTIME  Anesthesia Physical Anesthesia Plan  ASA: I  Anesthesia Plan: Spinal and MAC   Post-op Pain Management:  Regional for Post-op pain   Induction: Intravenous  PONV Risk Score and Plan: 1 and Propofol infusion, Ondansetron and Treatment may vary due to age or medical condition  Airway Management Planned: Simple Face Mask and Natural Airway  Additional Equipment:   Intra-op Plan:   Post-operative Plan:   Informed Consent: I have reviewed the patients History and Physical, chart, labs and discussed the procedure including the risks, benefits and alternatives for the proposed anesthesia with the patient or authorized representative who has indicated his/her understanding and acceptance.       Plan Discussed with:  CRNA  Anesthesia Plan Comments:       Anesthesia Quick Evaluation

## 2018-12-12 NOTE — Progress Notes (Signed)
Anesthesia Chart Review   Case:  [id number] Date/Time:  12/16/18 0715   Procedure:  TOTAL KNEE ARTHROPLASTY (Right )   Anesthesia type:  Spinal   Pre-op diagnosis:  RIGHT KNEE OSTEOARTHRITIS   Location:  [place] [facility]   Surgeon:  Vickey Huger, MD      DISCUSSION: 68 yo never smoker with h/o BPH, right knee OA scheduled for above procedure 12/16/18 with Dr. Vickey Huger.   Pt s/p left total knee 05/06/18.  Anesthesia records reviewed with no anesthesia complications noted.    Pre-op clearance received from Kathrynn Ducking, Trainer.    Pt can proceed with planned procedure barring acute status change.  VS: BP (!) 162/81 (BP Location: Left Arm)   Pulse 69   Temp 36.6 C (Oral)   Ht 5\' 9"  (1.753 m)   Wt 85 kg   SpO2 100%   BMI 27.68 kg/m   PROVIDERS: Raina Mina., MD is PCP    LABS: Labs reviewed: Acceptable for surgery. (all labs ordered are listed, but only abnormal results are displayed)  Labs Reviewed  SURGICAL PCR SCREEN  CBC WITH DIFFERENTIAL/PLATELET  COMPREHENSIVE METABOLIC PANEL     IMAGES:   EKG: 03/12/2018 Rate 66 bpm Sinus rhythm First degree A-V block  Otherwise normal ECG  CV:  Past Medical History:  Diagnosis Date  . BPH associated with nocturia   . ED (erectile dysfunction)   . OA (osteoarthritis)    knees,   . Wears glasses     Past Surgical History:  Procedure Laterality Date  . APPENDECTOMY  age 67  . BICEPS TENDON REPAIR Left 2012  . COLONOSCOPY  04/2017  . KNEE ARTHROSCOPY Left x2  last one 1990  . LAPAROSCOPIC CHOLECYSTECTOMY  2012  . [place]  . TONSILLECTOMY AND ADENOIDECTOMY  child  . TOTAL KNEE ARTHROPLASTY Left 05/06/2018   Procedure: LEFT TOTAL KNEE ARTHROPLASTY;  Surgeon: Vickey Huger, MD;  Location: [facility];  Service: Orthopedics;  Laterality: Left;  Adductor Block  . TRICEPS TENDON REPAIR Right x2  last one 2012   first time repair of tendon then post infection surgery  . UPPER GI ENDOSCOPY       MEDICATIONS: . acetaminophen (TYLENOL) 500 MG tablet  . Ascorbic Acid (VITAMIN C) 1000 MG tablet  . aspirin EC 81 MG tablet  . Misc Natural Products (GLUCOSAMINE CHONDROITIN TRIPLE) TABS  . Multiple Vitamin (MULTIVITAMIN WITH MINERALS) TABS tablet  . Omega-3 Fatty Acids (FISH OIL) 1000 MG CAPS   No current facility-administered medications for this encounter.    Maia Plan [facility] Pre-Surgical Testing [phone] 12/12/18 4:18 PM

## 2018-12-13 LAB — NOVEL CORONAVIRUS, NAA (HOSP ORDER, SEND-OUT TO REF LAB; TAT 18-24 HRS): SARS-CoV-2, NAA: NOT DETECTED

## 2018-12-13 NOTE — Progress Notes (Signed)
SPOKE W/  Via phone     SCREENING SYMPTOMS OF COVID 19:   COUGH--no  RUNNY NOSE--- no  SORE THROAT---no  NASAL CONGESTION----no  SNEEZING----no  SHORTNESS OF BREATH---no  DIFFICULTY BREATHING---no  TEMP >100.0 -----no  UNEXPLAINED BODY ACHES------no  CHILLS -------- no  HEADACHES ---------no  LOSS OF SMELL/ TASTE --------no    HAVE YOU OR ANY FAMILY MEMBER TRAVELLED PAST 14 DAYS OUT OF THE   COUNTY---no STATE----no COUNTRY----no  HAVE YOU OR ANY FAMILY MEMBER BEEN EXPOSED TO ANYONE WITH COVID 19? no

## 2018-12-15 MED ORDER — BUPIVACAINE LIPOSOME 1.3 % IJ SUSP
20.0000 mL | Freq: Once | INTRAMUSCULAR | Status: DC
  Filled 2018-12-15: qty 20

## 2018-12-16 ENCOUNTER — Other Ambulatory Visit: Payer: Self-pay

## 2018-12-16 ENCOUNTER — Encounter (HOSPITAL_COMMUNITY)
Admission: RE | Disposition: A | Discharge status: Home / Self Care | Payer: Self-pay | Source: Home / Self Care | Attending: Orthopedic Surgery

## 2018-12-16 ENCOUNTER — Observation Stay (HOSPITAL_COMMUNITY)
Admission: RE | Admit: 2018-12-16 | Discharge: 2018-12-17 | Disposition: A | Discharge status: Home / Self Care | Payer: PPO | Attending: Orthopedic Surgery | Admitting: Orthopedic Surgery

## 2018-12-16 ENCOUNTER — Encounter (HOSPITAL_COMMUNITY): Payer: Self-pay | Admitting: *Deleted

## 2018-12-16 ENCOUNTER — Ambulatory Visit (HOSPITAL_COMMUNITY): Payer: PPO | Admitting: Physician Assistant

## 2018-12-16 DIAGNOSIS — Z96652 Presence of left artificial knee joint: Secondary | ICD-10-CM | POA: Diagnosis not present

## 2018-12-16 DIAGNOSIS — G8918 Other acute postprocedural pain: Secondary | ICD-10-CM | POA: Diagnosis not present

## 2018-12-16 DIAGNOSIS — M1711 Unilateral primary osteoarthritis, right knee: Secondary | ICD-10-CM | POA: Diagnosis not present

## 2018-12-16 DIAGNOSIS — R351 Nocturia: Secondary | ICD-10-CM | POA: Diagnosis not present

## 2018-12-16 DIAGNOSIS — Z7982 Long term (current) use of aspirin: Secondary | ICD-10-CM | POA: Diagnosis not present

## 2018-12-16 DIAGNOSIS — N401 Enlarged prostate with lower urinary tract symptoms: Secondary | ICD-10-CM | POA: Diagnosis not present

## 2018-12-16 DIAGNOSIS — Z96659 Presence of unspecified artificial knee joint: Secondary | ICD-10-CM

## 2018-12-16 DIAGNOSIS — N529 Male erectile dysfunction, unspecified: Secondary | ICD-10-CM | POA: Insufficient documentation

## 2018-12-16 HISTORY — PX: TOTAL KNEE ARTHROPLASTY: SHX125

## 2018-12-16 SURGERY — ARTHROPLASTY, KNEE, TOTAL
Anesthesia: Monitor Anesthesia Care | Site: Knee | Laterality: Right

## 2018-12-16 MED ORDER — SODIUM CHLORIDE 0.9% FLUSH
INTRAVENOUS | Status: DC | PRN
  Administered 2018-12-16: 20 mL

## 2018-12-16 MED ORDER — SODIUM CHLORIDE 0.9 % IR SOLN
Status: DC | PRN
  Administered 2018-12-16: 1000 mL

## 2018-12-16 MED ORDER — OXYCODONE HCL 5 MG PO TABS: 5.0000 mg | ORAL_TABLET | ORAL | Status: DC | PRN

## 2018-12-16 MED ORDER — CEFAZOLIN SODIUM-DEXTROSE 2-4 GM/100ML-% IV SOLN
2.0000 g | Freq: Four times a day (QID) | INTRAVENOUS | Status: AC
  Administered 2018-12-16 (×2): 2 g via INTRAVENOUS
  Filled 2018-12-16 (×2): qty 100

## 2018-12-16 MED ORDER — ASPIRIN EC 325 MG PO TBEC
325.0000 mg | DELAYED_RELEASE_TABLET | Freq: Two times a day (BID) | ORAL | Status: DC
  Administered 2018-12-17: 08:00:00 325 mg via ORAL
  Filled 2018-12-16: qty 1

## 2018-12-16 MED ORDER — BUPIVACAINE-EPINEPHRINE (PF) 0.25% -1:200000 IJ SOLN
INTRAMUSCULAR | Status: DC | PRN
  Administered 2018-12-16: 20 mL via PERINEURAL

## 2018-12-16 MED ORDER — ACETAMINOPHEN 500 MG PO TABS
1000.0000 mg | ORAL_TABLET | Freq: Four times a day (QID) | ORAL | Status: AC
  Administered 2018-12-16 – 2018-12-17 (×4): 1000 mg via ORAL
  Filled 2018-12-16 (×4): qty 2

## 2018-12-16 MED ORDER — BUPIVACAINE LIPOSOME 1.3 % IJ SUSP
INTRAMUSCULAR | Status: DC | PRN
  Administered 2018-12-16: 20 mL

## 2018-12-16 MED ORDER — SODIUM CHLORIDE (PF) 0.9 % IJ SOLN
INTRAMUSCULAR | Status: AC
  Filled 2018-12-16: qty 20

## 2018-12-16 MED ORDER — SODIUM CHLORIDE 0.9 % IV SOLN
INTRAVENOUS | Status: DC
  Administered 2018-12-16 – 2018-12-17 (×2): via INTRAVENOUS

## 2018-12-16 MED ORDER — BUPIVACAINE-EPINEPHRINE (PF) 0.25% -1:200000 IJ SOLN
INTRAMUSCULAR | Status: AC
  Filled 2018-12-16: qty 30

## 2018-12-16 MED ORDER — DEXAMETHASONE SODIUM PHOSPHATE 10 MG/ML IJ SOLN: 8.0000 mg | Freq: Once | INTRAMUSCULAR | Status: DC

## 2018-12-16 MED ORDER — HYDROMORPHONE HCL 1 MG/ML IJ SOLN: 0.5000 mg | INTRAMUSCULAR | Status: DC | PRN

## 2018-12-16 MED ORDER — PHENOL 1.4 % MT LIQD
1.0000 | OROMUCOSAL | Status: DC | PRN
  Filled 2018-12-16: qty 177

## 2018-12-16 MED ORDER — FENTANYL CITRATE (PF) 100 MCG/2ML IJ SOLN: 25.0000 ug | INTRAMUSCULAR | Status: DC | PRN

## 2018-12-16 MED ORDER — ONDANSETRON HCL 4 MG PO TABS: 4.0000 mg | ORAL_TABLET | Freq: Four times a day (QID) | ORAL | Status: DC | PRN

## 2018-12-16 MED ORDER — GABAPENTIN 300 MG PO CAPS
300.0000 mg | ORAL_CAPSULE | Freq: Three times a day (TID) | ORAL | Status: DC
  Administered 2018-12-16 – 2018-12-17 (×3): 300 mg via ORAL
  Filled 2018-12-16 (×3): qty 1

## 2018-12-16 MED ORDER — FLEET ENEMA 7-19 GM/118ML RE ENEM: 1.0000 | ENEMA | Freq: Once | RECTAL | Status: DC | PRN

## 2018-12-16 MED ORDER — CELECOXIB 200 MG PO CAPS
400.0000 mg | ORAL_CAPSULE | Freq: Once | ORAL | Status: AC
  Administered 2018-12-16: 06:00:00 400 mg via ORAL
  Filled 2018-12-16: qty 2

## 2018-12-16 MED ORDER — ONDANSETRON HCL 4 MG/2ML IJ SOLN: 4.0000 mg | Freq: Four times a day (QID) | INTRAMUSCULAR | Status: DC | PRN

## 2018-12-16 MED ORDER — METOCLOPRAMIDE HCL 5 MG PO TABS: 5.0000 mg | ORAL_TABLET | Freq: Three times a day (TID) | ORAL | Status: DC | PRN

## 2018-12-16 MED ORDER — TRANEXAMIC ACID-NACL 1000-0.7 MG/100ML-% IV SOLN
1000.0000 mg | Freq: Once | INTRAVENOUS | Status: AC
  Administered 2018-12-16: 13:00:00 1000 mg via INTRAVENOUS
  Filled 2018-12-16: qty 100

## 2018-12-16 MED ORDER — EPHEDRINE SULFATE-NACL 50-0.9 MG/10ML-% IV SOSY
PREFILLED_SYRINGE | INTRAVENOUS | Status: DC | PRN
  Administered 2018-12-16 (×3): 10 mg via INTRAVENOUS

## 2018-12-16 MED ORDER — EPHEDRINE 5 MG/ML INJ
INTRAVENOUS | Status: AC
  Filled 2018-12-16: qty 10

## 2018-12-16 MED ORDER — TRAMADOL HCL 50 MG PO TABS
50.0000 mg | ORAL_TABLET | Freq: Four times a day (QID) | ORAL | Status: DC
  Administered 2018-12-16 – 2018-12-17 (×5): 50 mg via ORAL
  Filled 2018-12-16 (×5): qty 1

## 2018-12-16 MED ORDER — TRANEXAMIC ACID-NACL 1000-0.7 MG/100ML-% IV SOLN
1000.0000 mg | INTRAVENOUS | Status: AC
  Administered 2018-12-16: 07:00:00 1000 mg via INTRAVENOUS
  Filled 2018-12-16: qty 100

## 2018-12-16 MED ORDER — BUPIVACAINE IN DEXTROSE 0.75-8.25 % IT SOLN
INTRATHECAL | Status: DC | PRN
  Administered 2018-12-16: 1.8 mL via INTRATHECAL

## 2018-12-16 MED ORDER — ZOLPIDEM TARTRATE 5 MG PO TABS: 5.0000 mg | ORAL_TABLET | Freq: Every evening | ORAL | Status: DC | PRN

## 2018-12-16 MED ORDER — MENTHOL 3 MG MT LOZG: 1.0000 | LOZENGE | OROMUCOSAL | Status: DC | PRN

## 2018-12-16 MED ORDER — DOCUSATE SODIUM 100 MG PO CAPS
100.0000 mg | ORAL_CAPSULE | Freq: Two times a day (BID) | ORAL | Status: DC
  Administered 2018-12-16 – 2018-12-17 (×2): 100 mg via ORAL
  Filled 2018-12-16 (×3): qty 1

## 2018-12-16 MED ORDER — LACTATED RINGERS IV SOLN
INTRAVENOUS | Status: DC
  Administered 2018-12-16 (×2): via INTRAVENOUS

## 2018-12-16 MED ORDER — DEXAMETHASONE SODIUM PHOSPHATE 10 MG/ML IJ SOLN
10.0000 mg | Freq: Once | INTRAMUSCULAR | Status: AC
  Administered 2018-12-17: 08:00:00 10 mg via INTRAVENOUS
  Filled 2018-12-16: qty 1

## 2018-12-16 MED ORDER — BISACODYL 5 MG PO TBEC: 5.0000 mg | DELAYED_RELEASE_TABLET | Freq: Every day | ORAL | Status: DC | PRN

## 2018-12-16 MED ORDER — CEFAZOLIN SODIUM-DEXTROSE 2-4 GM/100ML-% IV SOLN
2.0000 g | INTRAVENOUS | Status: AC
  Administered 2018-12-16: 07:00:00 2 g via INTRAVENOUS
  Filled 2018-12-16: qty 100

## 2018-12-16 MED ORDER — CELECOXIB 200 MG PO CAPS
ORAL_CAPSULE | ORAL | Status: AC
  Administered 2018-12-16: 06:00:00 400 mg via ORAL
  Filled 2018-12-16: qty 2

## 2018-12-16 MED ORDER — MIDAZOLAM HCL 2 MG/2ML IJ SOLN
INTRAMUSCULAR | Status: AC
  Filled 2018-12-16: qty 2

## 2018-12-16 MED ORDER — CHLORHEXIDINE GLUCONATE 4 % EX LIQD: 60.0000 mL | Freq: Once | CUTANEOUS | Status: DC

## 2018-12-16 MED ORDER — MIDAZOLAM HCL 2 MG/2ML IJ SOLN
INTRAMUSCULAR | Status: DC | PRN
  Administered 2018-12-16: 2 mg via INTRAVENOUS

## 2018-12-16 MED ORDER — ONDANSETRON HCL 4 MG/2ML IJ SOLN
INTRAMUSCULAR | Status: AC
  Filled 2018-12-16: qty 2

## 2018-12-16 MED ORDER — STERILE WATER FOR IRRIGATION IR SOLN
Status: DC | PRN
  Administered 2018-12-16: 2000 mL

## 2018-12-16 MED ORDER — GABAPENTIN 300 MG PO CAPS
300.0000 mg | ORAL_CAPSULE | Freq: Once | ORAL | Status: AC
  Administered 2018-12-16: 06:00:00 300 mg via ORAL
  Filled 2018-12-16: qty 1

## 2018-12-16 MED ORDER — FENTANYL CITRATE (PF) 100 MCG/2ML IJ SOLN
INTRAMUSCULAR | Status: AC
  Filled 2018-12-16: qty 2

## 2018-12-16 MED ORDER — FENTANYL CITRATE (PF) 250 MCG/5ML IJ SOLN
INTRAMUSCULAR | Status: DC | PRN
  Administered 2018-12-16 (×2): 50 ug via INTRAVENOUS

## 2018-12-16 MED ORDER — FERROUS SULFATE 325 (65 FE) MG PO TABS
325.0000 mg | ORAL_TABLET | Freq: Three times a day (TID) | ORAL | Status: DC
  Administered 2018-12-16 – 2018-12-17 (×3): 325 mg via ORAL
  Filled 2018-12-16 (×3): qty 1

## 2018-12-16 MED ORDER — BUPIVACAINE-EPINEPHRINE (PF) 0.25% -1:200000 IJ SOLN
INTRAMUSCULAR | Status: DC | PRN
  Administered 2018-12-16: 30 mL

## 2018-12-16 MED ORDER — DEXAMETHASONE SODIUM PHOSPHATE 10 MG/ML IJ SOLN
INTRAMUSCULAR | Status: DC | PRN
  Administered 2018-12-16: 10 mg via INTRAVENOUS

## 2018-12-16 MED ORDER — ACETAMINOPHEN 500 MG PO TABS
1000.0000 mg | ORAL_TABLET | Freq: Once | ORAL | Status: AC
  Administered 2018-12-16: 06:00:00 1000 mg via ORAL
  Filled 2018-12-16: qty 2

## 2018-12-16 MED ORDER — POVIDONE-IODINE 10 % EX SWAB
2.0000 "application " | Freq: Once | CUTANEOUS | Status: AC
  Administered 2018-12-16: 2 via TOPICAL

## 2018-12-16 MED ORDER — SENNOSIDES-DOCUSATE SODIUM 8.6-50 MG PO TABS: 1.0000 | ORAL_TABLET | Freq: Every evening | ORAL | Status: DC | PRN

## 2018-12-16 MED ORDER — METHOCARBAMOL 500 MG PO TABS: 500.0000 mg | ORAL_TABLET | Freq: Four times a day (QID) | ORAL | Status: DC | PRN

## 2018-12-16 MED ORDER — METOCLOPRAMIDE HCL 5 MG/ML IJ SOLN: 5.0000 mg | Freq: Three times a day (TID) | INTRAMUSCULAR | Status: DC | PRN

## 2018-12-16 MED ORDER — METHOCARBAMOL 1000 MG/10ML IJ SOLN
500.0000 mg | Freq: Four times a day (QID) | INTRAVENOUS | Status: DC | PRN
  Filled 2018-12-16: qty 5

## 2018-12-16 MED ORDER — PROPOFOL 500 MG/50ML IV EMUL
INTRAVENOUS | Status: DC | PRN
  Administered 2018-12-16: 100 ug/kg/min via INTRAVENOUS

## 2018-12-16 MED ORDER — ALUM & MAG HYDROXIDE-SIMETH 200-200-20 MG/5ML PO SUSP: 30.0000 mL | ORAL | Status: DC | PRN

## 2018-12-16 MED ORDER — PROPOFOL 10 MG/ML IV BOLUS
INTRAVENOUS | Status: AC
  Filled 2018-12-16: qty 80

## 2018-12-16 MED ORDER — DIPHENHYDRAMINE HCL 12.5 MG/5ML PO ELIX: 12.5000 mg | ORAL_SOLUTION | ORAL | Status: DC | PRN

## 2018-12-16 MED ORDER — PANTOPRAZOLE SODIUM 40 MG PO TBEC
40.0000 mg | DELAYED_RELEASE_TABLET | Freq: Every day | ORAL | Status: DC
  Administered 2018-12-16 – 2018-12-17 (×2): 40 mg via ORAL
  Filled 2018-12-16: qty 1

## 2018-12-16 MED ORDER — ONDANSETRON HCL 4 MG/2ML IJ SOLN
INTRAMUSCULAR | Status: DC | PRN
  Administered 2018-12-16: 4 mg via INTRAVENOUS

## 2018-12-16 MED ORDER — 0.9 % SODIUM CHLORIDE (POUR BTL) OPTIME
TOPICAL | Status: DC | PRN
  Administered 2018-12-16: 1000 mL

## 2018-12-16 MED ORDER — PROMETHAZINE HCL 25 MG/ML IJ SOLN: 6.2500 mg | INTRAMUSCULAR | Status: DC | PRN

## 2018-12-16 SURGICAL SUPPLY — 56 items
BAG ZIPLOCK 12X15 (MISCELLANEOUS) ×2 IMPLANT
BANDAGE ACE 6X5 VEL STRL LF (GAUZE/BANDAGES/DRESSINGS) ×2 IMPLANT
BLADE SAGITTAL 13X1.27X60 (BLADE) ×2 IMPLANT
BLADE SAW SGTL 83.5X18.5 (BLADE) ×2 IMPLANT
BLADE SURG 15 STRL LF DISP TIS (BLADE) ×1 IMPLANT
BLADE SURG 15 STRL SS (BLADE) ×1
BLADE SURG SZ10 CARB STEEL (BLADE) ×4 IMPLANT
BOWL SMART MIX CTS (DISPOSABLE) ×2 IMPLANT
CEMENT BONE SIMPLEX SPEEDSET (Cement) ×4 IMPLANT
CHLORAPREP W/TINT 26 (MISCELLANEOUS) ×4 IMPLANT
COVER SURGICAL LIGHT HANDLE (MISCELLANEOUS) ×2 IMPLANT
COVER WAND RF STERILE (DRAPES) IMPLANT
CUFF TOURN SGL QUICK 34 (TOURNIQUET CUFF) ×1
CUFF TRNQT CYL 34X4.125X (TOURNIQUET CUFF) ×1 IMPLANT
DECANTER SPIKE VIAL GLASS SM (MISCELLANEOUS) ×4 IMPLANT
DRAPE INCISE IOBAN 66X45 STRL (DRAPES) ×4 IMPLANT
DRAPE U-SHAPE 47X51 STRL (DRAPES) ×2 IMPLANT
DRSG AQUACEL AG ADV 3.5X10 (GAUZE/BANDAGES/DRESSINGS) ×2 IMPLANT
ELECT REM PT RETURN 15FT ADLT (MISCELLANEOUS) ×2 IMPLANT
FEMUR  CMT CCR STD SZ10 R KNEE (Knees) ×1 IMPLANT
FEMUR CMT CCR STD SZ10 R KNEE (Knees) ×1 IMPLANT
FEMUR CMTD CCR STD SZ10 R KNEE (Knees) ×1 IMPLANT
GLOVE BIOGEL M STRL SZ7.5 (GLOVE) ×2 IMPLANT
GLOVE BIOGEL PI IND STRL 7.5 (GLOVE) ×1 IMPLANT
GLOVE BIOGEL PI IND STRL 8.5 (GLOVE) ×2 IMPLANT
GLOVE BIOGEL PI INDICATOR 7.5 (GLOVE) ×1
GLOVE BIOGEL PI INDICATOR 8.5 (GLOVE) ×2
GLOVE SURG ORTHO 8.0 STRL STRW (GLOVE) ×6 IMPLANT
GOWN STRL REUS W/ TWL XL LVL3 (GOWN DISPOSABLE) ×2 IMPLANT
GOWN STRL REUS W/TWL XL LVL3 (GOWN DISPOSABLE) ×2
HANDPIECE INTERPULSE COAX TIP (DISPOSABLE) ×1
HOLDER FOLEY CATH W/STRAP (MISCELLANEOUS) ×2 IMPLANT
HOOD PEEL AWAY FLYTE STAYCOOL (MISCELLANEOUS) ×6 IMPLANT
INSERT TIB POST EF/10-11X10 (Insert) ×2 IMPLANT
KIT TURNOVER KIT A (KITS) IMPLANT
MANIFOLD NEPTUNE II (INSTRUMENTS) ×2 IMPLANT
NEEDLE HYPO 22GX1.5 SAFETY (NEEDLE) ×2 IMPLANT
NS IRRIG 1000ML POUR BTL (IV SOLUTION) ×2 IMPLANT
PACK TOTAL KNEE CUSTOM (KITS) ×2 IMPLANT
PROTECTOR NERVE ULNAR (MISCELLANEOUS) ×2 IMPLANT
SET HNDPC FAN SPRY TIP SCT (DISPOSABLE) ×1 IMPLANT
STEM COMP PATELLA VE 38 (Knees) ×2 IMPLANT
STEM TIBIA 5 DEG SZ F R KNEE (Knees) ×1 IMPLANT
STRIP CLOSURE SKIN 1/2X4 (GAUZE/BANDAGES/DRESSINGS) ×2 IMPLANT
SUT BONE WAX W31G (SUTURE) ×2 IMPLANT
SUT MNCRL AB 3-0 PS2 18 (SUTURE) ×2 IMPLANT
SUT STRATAFIX 0 PDS 27 VIOLET (SUTURE) ×2
SUT STRATAFIX PDS+ 0 24IN (SUTURE) ×2 IMPLANT
SUT VIC AB 1 CT1 36 (SUTURE) ×2 IMPLANT
SUTURE STRATFX 0 PDS 27 VIOLET (SUTURE) ×1 IMPLANT
SYR CONTROL 10ML LL (SYRINGE) ×4 IMPLANT
TIBIA STEM 5 DEG SZ F R KNEE (Knees) ×2 IMPLANT
TRAY FOLEY MTR SLVR 16FR STAT (SET/KITS/TRAYS/PACK) ×2 IMPLANT
WATER STERILE IRR 1000ML POUR (IV SOLUTION) ×4 IMPLANT
WRAP KNEE MAXI GEL POST OP (GAUZE/BANDAGES/DRESSINGS) ×2 IMPLANT
YANKAUER SUCT BULB TIP 10FT TU (MISCELLANEOUS) ×2 IMPLANT

## 2018-12-16 NOTE — Anesthesia Procedure Notes (Signed)
Anesthesia Regional Block: Adductor canal block   Pre-Anesthetic Checklist: ,, timeout performed, Correct Patient, Correct Site, Correct Laterality, Correct Procedure, Correct Position, site marked, Risks and benefits discussed,  Surgical consent,  Pre-op evaluation,  At surgeon's request and post-op pain management  Laterality: Right  Prep: chloraprep       Needles:  Injection technique: Single-shot  Needle Type: Echogenic Needle     Needle Length: 9cm  Needle Gauge: 21     Additional Needles:   Procedures:,,,, ultrasound used (permanent image in chart),,,,  Narrative:  Start time: 12/16/2018 6:49 AM End time: 12/16/2018 6:55 AM Injection made incrementally with aspirations every 5 mL.  Performed by: Personally  Anesthesiologist: Suzette Battiest, MD

## 2018-12-16 NOTE — Op Note (Signed)
TOTAL KNEE REPLACEMENT OPERATIVE NOTE:  12/16/2018  9:06 AM  PATIENT:  Philip Preston  68 y.o. male  PRE-OPERATIVE DIAGNOSIS:  RIGHT KNEE OSTEOARTHRITIS  POST-OPERATIVE DIAGNOSIS:  RIGHT KNEE OSTEOARTHRITIS  PROCEDURE:  Procedure(s): TOTAL KNEE ARTHROPLASTY  SURGEON:  Surgeon(s): Vickey Huger, MD  PHYSICIAN ASSISTANT: Carlyon Shadow, PA-C   ANESTHESIA:   spinal  SPECIMEN: None  COUNTS:  Correct  TOURNIQUET:   Total Tourniquet Time Documented: Thigh (Right) - 46 minutes Total: Thigh (Right) - 46 minutes   DICTATION:  Indication for procedure:    The patient is a 68 y.o. male who has failed conservative treatment for RIGHT KNEE OSTEOARTHRITIS.  Informed consent was obtained prior to anesthesia. The risks versus benefits of the operation were explain and in a way the patient can, and did, understand.    this patient did receive a polyethylene insert with vitamin E which is a high demand implant.  Description of procedure:     The patient was taken to the operating room and placed under anesthesia.  The patient was positioned in the usual fashion taking care that all body parts were adequately padded and/or protected.  A tourniquet was applied and the leg prepped and draped in the usual sterile fashion.  The extremity was exsanguinated with the esmarch and tourniquet inflated to 300 mmHg.  Pre-operative range of motion was normal.  The knee was in 3 degree of mild varus.  A midline incision approximately 6-7 inches long was made with a #10 blade.  A new blade was used to make a parapatellar arthrotomy going 2-3 cm into the quadriceps tendon, over the patella, and alongside the medial aspect of the patellar tendon.  A synovectomy was then performed with the #10 blade and forceps. I then elevated the deep MCL off the medial tibial metaphysis subperiosteally around to the semimembranosus attachment.    I everted the patella and used calipers to measure patellar thickness.   I used the reamer to ream down to appropriate thickness to recreate the native thickness.  I then removed excess bone with the rongeur and sagittal saw.  I used the appropriately sized template and drilled the three lug holes.  I then put the trial in place and measured the thickness with the calipers to ensure recreation of the native thickness.  The trial was then removed and the patella subluxed and the knee brought into flexion.  A homan retractor was place to retract and protect the patella and lateral structures.  A Z-retractor was place medially to protect the medial structures.  The extra-medullary alignment system was used to make cut the tibial articular surface perpendicular to the anamotic axis of the tibia and in 3 degrees of posterior slope.  The cut surface and alignment jig was removed.  I then used the intramedullary alignment guide to make a valgus cut on the distal femur.  I then marked out the epicondylar axis on the distal femur.  I then used the anterior referencing sizer and measured the femur to be a size 10.  The 4-In-1 cutting block was screwed into place in external rotation matching the posterior condylar angle, making our cuts perpendicular to the epicondylar axis.  Anterior, posterior and chamfer cuts were made with the sagittal saw.  The cutting block and cut pieces were removed.  A lamina spreader was placed in 90 degrees of flexion.  The ACL, PCL, menisci, and posterior condylar osteophytes were removed.  A 10 mm spacer blocked was found to offer  good flexion and extension gap balance after minimal in degree releasing.   The scoop retractor was then placed and the femoral finishing block was pinned in place.  The small sagittal saw was used as well as the lug drill to finish the femur.  The block and cut surfaces were removed and the medullary canal hole filled with autograft bone from the cut pieces.  The tibia was delivered forward in deep flexion and external rotation.  A  size F tray was selected and pinned into place centered on the medial 1/3 of the tibial tubercle.  The reamer and keel was used to prepare the tibia through the tray.    I then trialed with the size 10 femur, size F tibia, a 10 mm insert and the 38 patella.  I had excellent flexion/extension gap balance, excellent patella tracking.  Flexion was full and beyond 120 degrees; extension was zero.  These components were chosen and the staff opened them to me on the back table while the knee was lavaged copiously and the cement mixed.  The soft tissue was infiltrated with 60cc of exparel 1.3% through a 21 gauge needle.  I cemented in the components and removed all excess cement.  The polyethylene tibial component was snapped into place and the knee placed in extension while cement was hardening.  The capsule was infilltrated with a 60cc exparel/marcaine/saline mixture.   Once the cement was hard, the tourniquet was let down.  Hemostasis was obtained.  The arthrotomy was closed using a #1 stratofix running suture.  The deep soft tissues were closed with #0 vicryls and the subcuticular layer closed with #2-0 vicryl.  The skin was reapproximated and closed with 3.0 Monocryl.  The wound was covered with steristrips, aquacel dressing, and a TED stocking.   The patient was then awakened, extubated, and taken to the recovery room in stable condition.  BLOOD LOSS:  361WE COMPLICATIONS:  None.  PLAN OF CARE: Admit for overnight observation  PATIENT DISPOSITION:  PACU - hemodynamically stable.    Please fax a copy of this op note to my office at [phone] (please only include page 1 and 2 of the Case Information op note)

## 2018-12-16 NOTE — Transfer of Care (Signed)
Immediate Anesthesia Transfer of Care Note  Patient: Philip Preston  Procedure(s) Performed: TOTAL KNEE ARTHROPLASTY (Right Knee)  Patient Location: PACU  Anesthesia Type:Spinal and MAC combined with regional for post-op pain  Level of Consciousness: awake, alert  and oriented  Airway & Oxygen Therapy: Patient Spontanous Breathing and Patient connected to face mask oxygen  Post-op Assessment: Report given to RN and Post -op Vital signs reviewed and stable  Post vital signs: Reviewed and stable  Last Vitals:  Vitals Value Taken Time  BP 112/57 12/16/2018  8:51 AM  Temp    Pulse 69 12/16/2018  8:52 AM  Resp 16 12/16/2018  8:52 AM  SpO2 100 % 12/16/2018  8:52 AM  Vitals shown include unvalidated device data.  Last Pain:  Vitals:   12/16/18 0547  TempSrc: Oral      Patients Stated Pain Goal: 4 (82/99/37 1696)  Complications: No apparent anesthesia complications

## 2018-12-16 NOTE — Plan of Care (Signed)
Plan of care 

## 2018-12-16 NOTE — Progress Notes (Signed)
Orthopedic Tech Progress Note Patient Details:  Philip Preston [birth year] [id number] Bone foam applied to right leg. Ortho Devices Type of Ortho Device: CPM padding   Post Interventions Patient Tolerated: Well Instructions Provided: Care of device, Adjustment of device   Ladell Pier [facility] 12/16/2018, 2:07 PM

## 2018-12-16 NOTE — Anesthesia Procedure Notes (Signed)
Spinal  Patient location during procedure: OR Start time: 12/16/2018 7:13 AM End time: 12/16/2018 7:18 AM Staffing Anesthesiologist: Suzette Battiest, MD Resident/CRNA: Genelle Bal, CRNA Performed: resident/CRNA  Preanesthetic Checklist Completed: patient identified, site marked, surgical consent, pre-op evaluation, timeout performed, IV checked, risks and benefits discussed and monitors and equipment checked Spinal Block Patient position: sitting Prep: DuraPrep Patient monitoring: heart rate, cardiac monitor, continuous pulse ox and blood pressure Approach: midline Location: L3-4 Injection technique: single-shot Needle Needle type: Sprotte  Needle gauge: 24 G Needle length: 9 cm Assessment Sensory level: T4

## 2018-12-16 NOTE — H&P (Signed)
Philip Preston MRN:  [id number] DOB/SEX:  November 25, 1950/male  CHIEF COMPLAINT:  Painful right Knee  HISTORY: Patient is a 68 y.o. male presented with a history of pain in the right knee. Onset of symptoms was gradual starting a few years ago with gradually worsening course since that time. Patient has been treated conservatively with over-the-counter NSAIDs and activity modification. Patient currently rates pain in the knee at 10 out of 10 with activity. There is pain at night.  PAST MEDICAL HISTORY: Patient Active Problem List   Diagnosis Date Noted  . 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 BPH associated with nocturia   . ED (erectile dysfunction)   . OA (osteoarthritis)    knees,   . Wears glasses    Past Surgical History:  Procedure Laterality Date  . APPENDECTOMY  age 63  . BICEPS TENDON REPAIR Left 2012  . COLONOSCOPY  04/2017  . KNEE ARTHROSCOPY Left x2  last one 1990  . LAPAROSCOPIC CHOLECYSTECTOMY  2012  . [place]  . TONSILLECTOMY AND ADENOIDECTOMY  child  . TOTAL KNEE ARTHROPLASTY Left 05/06/2018   Procedure: LEFT TOTAL KNEE ARTHROPLASTY;  Surgeon: Vickey Huger, MD;  Location: [facility];  Service: Orthopedics;  Laterality: Left;  Adductor Block  . TRICEPS TENDON REPAIR Right x2  last one 2012   first time repair of tendon then post infection surgery  . UPPER GI ENDOSCOPY       MEDICATIONS:   Medications Prior to Admission  Medication Sig Dispense Refill Last Dose  . acetaminophen (TYLENOL) 500 MG tablet Take 2 tablets (1,000 mg total) by mouth every 6 (six) hours. (Patient taking differently: Take 1,000 mg by mouth every 6 (six) hours as needed for moderate pain. ) 30 tablet 0   . Ascorbic Acid (VITAMIN C) 1000 MG tablet Take 1,000 mg by mouth daily.   Past Week at Unknown time  . aspirin EC 81 MG tablet Take 81 mg by mouth daily.   Past Month at Unknown time  . Misc Natural Products (GLUCOSAMINE  CHONDROITIN TRIPLE) TABS Take 1 tablet by mouth daily.   Past Month at Unknown time  . Multiple Vitamin (MULTIVITAMIN WITH MINERALS) TABS tablet Take 1 tablet by mouth daily.   Past Month at Unknown time  . Omega-3 Fatty Acids (FISH OIL) 1000 MG CAPS Take 1,000 mg by mouth daily.   Past Month at Unknown time    ALLERGIES:   Allergies  Allergen Reactions  . Levofloxacin Rash    Tore tendons     REVIEW OF SYSTEMS:  A comprehensive review of systems was negative except for: Musculoskeletal: positive for arthralgias and bone pain   FAMILY HISTORY:  History reviewed. No pertinent family history.  SOCIAL HISTORY:   Social History   Tobacco Use  . Smoking status: Never Smoker  . Smokeless tobacco: Never Used  Substance Use Topics  . Alcohol use: Never    Frequency: Never     EXAMINATION:  Vital signs in last 24 hours: Temp:  [97.7 F (36.5 C)] 97.7 F (36.5 C) (05/11 0547) Pulse Rate:  [63] 63 (05/11 0547) Resp:  [14] 14 (05/11 0547) BP: (138)/(80) 138/80 (05/11 0547) SpO2:  [98 %] 98 % (05/11 0547) Weight:  [85 kg] 85 kg (05/11 0547)  BP 138/80   Pulse 63   Temp 97.7 F (36.5 C) (Oral)   Resp 14   Ht 5\' 9"  (1.753 m)  Wt 85 kg   SpO2 98%   BMI 27.68 kg/m   General Appearance:    Alert, cooperative, no distress, appears stated age  Head:    Normocephalic, without obvious abnormality, atraumatic  Eyes:    PERRL, conjunctiva/corneas clear, EOM's intact, fundi    benign, both eyes       Ears:    Normal TM's and external ear canals, both ears  Nose:   Nares normal, septum midline, mucosa normal, no drainage    or sinus tenderness  Throat:   Lips, mucosa, and tongue normal; teeth and gums normal  Neck:   Supple, symmetrical, trachea midline, no adenopathy;       thyroid:  No enlargement/tenderness/nodules; no carotid   bruit or JVD  Back:     Symmetric, no curvature, ROM normal, no CVA tenderness  Lungs:     Clear to auscultation bilaterally, respirations unlabored   Chest wall:    No tenderness or deformity  Heart:    Regular rate and rhythm, S1 and S2 normal, no murmur, rub   or gallop  Abdomen:     Soft, non-tender, bowel sounds active all four quadrants,    no masses, no organomegaly  Genitalia:    Normal male without lesion, discharge or tenderness  Rectal:    Normal tone, normal prostate, no masses or tenderness;   guaiac negative stool  Extremities:   Extremities normal, atraumatic, no cyanosis or edema  Pulses:   2+ and symmetric all extremities  Skin:   Skin color, texture, turgor normal, no rashes or lesions  Lymph nodes:   Cervical, supraclavicular, and axillary nodes normal  Neurologic:   CNII-XII intact. Normal strength, sensation and reflexes      throughout    Musculoskeletal:  ROM 0-120, Ligaments intact,  Imaging Review Plain radiographs demonstrate severe degenerative joint disease of the right knee. The overall alignment is neutral. The bone quality appears to be excellent for age and reported activity level.  Assessment/Plan: Primary osteoarthritis, right knee   The patient history, physical examination and imaging studies are consistent with advanced degenerative joint disease of the right knee. The patient has failed conservative treatment.  The clearance notes were reviewed.  After discussion with the patient it was felt that Total Knee Replacement was indicated. The procedure,  risks, and benefits of total knee arthroplasty were presented and reviewed. The risks including but not limited to aseptic loosening, infection, blood clots, vascular injury, stiffness, patella tracking problems complications among others were discussed. The patient acknowledged the explanation, agreed to proceed with the plan.  Preoperative templating of the joint replacement has been completed, documented, and submitted to the Operating Room personnel in order to optimize intra-operative equipment management.    Patient's anticipated LOS is less than  2 midnights, meeting these requirements: - Lives within 1 hour of care - Has a competent adult at home to recover with post-op recover - NO history of  - Chronic pain requiring opiods  - Diabetes  - Coronary Artery Disease  - Heart failure  - Heart attack  - Stroke  - DVT/VTE  - Cardiac arrhythmia  - Respiratory Failure/COPD  - Renal failure  - Anemia  - Advanced Liver disease       Donia Ast 12/16/2018, 6:14 AM

## 2018-12-16 NOTE — Evaluation (Signed)
Physical Therapy Evaluation Patient Details Name: Philip Preston MRN: [id number] DOB: [birth year] Today's Date: 12/16/2018   History of Present Illness  68 yo male s/p R TKR on 12/16/18. PMH includes L TKR 04/2018, L biceps tendon repair, R triceps tendon repair, lumar spine surgery.   Clinical Impression   Pt presents with mild R knee pain, post-surgical decreased RLE strength and ROM, and decreased activity tolerance. Pt to benefit from acute PT to address deficits. Pt ambulated 90 ft with RW with min guard assist, verbal cuing for form and safety provided throughout. Pt educated on ankle pumps (20/hour) to perform this afternoon/evening to increase circulation, to pt's tolerance and limited by pain. PT to progress mobility as tolerated, and will continue to follow acutely.        Follow Up Recommendations Supervision for mobility/OOB;Follow surgeon's recommendation for DC plan and follow-up therapies    Equipment Recommendations  None recommended by PT    Recommendations for Other Services       Precautions / Restrictions Precautions Precautions: Fall Restrictions Weight Bearing Restrictions: No Other Position/Activity Restrictions: WBAT       Mobility  Bed Mobility Overal bed mobility: Needs Assistance Bed Mobility: Supine to Sit     Supine to sit: Min guard;HOB elevated     General bed mobility comments: Min guard for safety, pt with increased time to lift RLE and bring to EOB.   Transfers Overall transfer level: Needs assistance Equipment used: Rolling walker (2 wheeled) Transfers: Sit to/from Stand Sit to Stand: Min guard         General transfer comment: Min guard for safety, pt with proper hand placement upon rising.   Ambulation/Gait Ambulation/Gait assistance: Min guard Gait Distance (Feet): 90 Feet Assistive device: Rolling walker (2 wheeled) Gait Pattern/deviations: Step-to pattern;Step-through pattern;Decreased stride length;Antalgic;Trunk  flexed Gait velocity: decr    General Gait Details: Min guard for safety, verbal cuing for upright posture and placement in RW. Pt with tendency to pick up RW with ambulation, pt instructed to "push RW like a grocery cart"   Stairs            Wheelchair Mobility    Modified Rankin (Stroke Patients Only)       Balance Overall balance assessment: Mild deficits observed, not formally tested                                           Pertinent Vitals/Pain Pain Assessment: 0-10 Pain Score: 3  Pain Location: R knee  Pain Descriptors / Indicators: Sore Pain Intervention(s): Monitored during session;Repositioned;Limited activity within patient's tolerance    Home Living Family/patient expects to be discharged to:: Private residence Living Arrangements: Spouse/significant other Available Help at Discharge: Family;Available 24 hours/day Type of Home: House Home Access: Stairs to enter Entrance Stairs-Rails: None Entrance Stairs-Number of Steps: 3 Home Layout: One level Home Equipment: Crutches;Walker - 2 wheels;Cane - single point      Prior Function Level of Independence: Independent         Comments: Pt is a power lifter, states he was up to squatting 315# PTA.      Hand Dominance   Dominant Hand: Right    Extremity/Trunk Assessment   Upper Extremity Assessment Upper Extremity Assessment: Overall WFL for tasks assessed    Lower Extremity Assessment Lower Extremity Assessment: Overall WFL for tasks assessed;RLE deficits/detail RLE Deficits / Details: suspected  post-operative weakness; able to perform ankle pumps, quad set, heel slides, SLR without lift assist  RLE Sensation: WNL    Cervical / Trunk Assessment Cervical / Trunk Assessment: Normal  Communication   Communication: No difficulties  Cognition Arousal/Alertness: Awake/alert Behavior During Therapy: WFL for tasks assessed/performed Overall Cognitive Status: Within Functional  Limits for tasks assessed                                        General Comments      Exercises Total Joint Exercises Ankle Circles/Pumps: AROM;Both;10 reps;Seated Goniometric ROM: knee aarom ~5-75*, limited by pain    Assessment/Plan    PT Assessment Patient needs continued PT services  PT Problem List Decreased strength;Decreased mobility;Decreased range of motion;Decreased activity tolerance;Decreased balance       PT Treatment Interventions DME instruction;Functional mobility training;Balance training;Patient/family education;Gait training;Therapeutic activities;Stair training;Therapeutic exercise;Neuromuscular re-education    PT Goals (Current goals can be found in the Care Plan section)  Acute Rehab PT Goals Patient Stated Goal: return home PT Goal Formulation: With patient Time For Goal Achievement: 12/23/18 Potential to Achieve Goals: Good    Frequency 7X/week   Barriers to discharge        Co-evaluation               AM-PAC PT "6 Clicks" Mobility  Outcome Measure Help needed turning from your back to your side while in a flat bed without using bedrails?: A Little Help needed moving from lying on your back to sitting on the side of a flat bed without using bedrails?: A Little Help needed moving to and from a bed to a chair (including a wheelchair)?: A Little Help needed standing up from a chair using your arms (e.g., wheelchair or bedside chair)?: A Little Help needed to walk in hospital room?: A Little Help needed climbing 3-5 steps with a railing? : A Little 6 Click Score: 18    End of Session Equipment Utilized During Treatment: Gait belt;Oxygen(O2 reapplied after PT) Activity Tolerance: Patient tolerated treatment well Patient left: in chair;with call bell/phone within reach;with chair alarm set;with SCD's reapplied Nurse Communication: Mobility status PT Visit Diagnosis: Other abnormalities of gait and mobility (R26.89);Difficulty  in walking, not elsewhere classified (R26.2)    Time: 3662-9476 PT Time Calculation (min) (ACUTE ONLY): 20 min   Charges:   PT Evaluation $PT Eval Low Complexity: 1 Low        Adelai Achey Conception Chancy, PT Acute Rehabilitation Services Pager [phone]  Office [phone]  Rober Skeels D Aleyah Balik 12/16/2018, 5:00 PM

## 2018-12-17 ENCOUNTER — Encounter (HOSPITAL_COMMUNITY): Payer: Self-pay | Admitting: Orthopedic Surgery

## 2018-12-17 DIAGNOSIS — Z96651 Presence of right artificial knee joint: Secondary | ICD-10-CM | POA: Diagnosis not present

## 2018-12-17 DIAGNOSIS — M1711 Unilateral primary osteoarthritis, right knee: Secondary | ICD-10-CM | POA: Diagnosis not present

## 2018-12-17 LAB — CBC
HCT: 33.7 % — ABNORMAL LOW (ref 39.0–52.0)
Hemoglobin: 11.3 g/dL — ABNORMAL LOW (ref 13.0–17.0)
MCH: 31.9 pg (ref 26.0–34.0)
MCHC: 33.5 g/dL (ref 30.0–36.0)
MCV: 95.2 fL (ref 80.0–100.0)
Platelets: 162 10*3/uL (ref 150–400)
RBC: 3.54 MIL/uL — ABNORMAL LOW (ref 4.22–5.81)
RDW: 12.4 % (ref 11.5–15.5)
WBC: 11.2 10*3/uL — ABNORMAL HIGH (ref 4.0–10.5)
nRBC: 0 % (ref 0.0–0.2)

## 2018-12-17 LAB — BASIC METABOLIC PANEL
Anion gap: 6 (ref 5–15)
BUN: 16 mg/dL (ref 8–23)
CO2: 27 mmol/L (ref 22–32)
Calcium: 8.3 mg/dL — ABNORMAL LOW (ref 8.9–10.3)
Chloride: 101 mmol/L (ref 98–111)
Creatinine, Ser: 0.8 mg/dL (ref 0.61–1.24)
GFR calc Af Amer: >60 mL/min (ref >60)
GFR calc non Af Amer: >60 mL/min (ref >60)
Glucose, Bld: 130 mg/dL — ABNORMAL HIGH (ref 70–99)
Potassium: 4.2 mmol/L (ref 3.5–5.1)
Sodium: 134 mmol/L — ABNORMAL LOW (ref 135–145)

## 2018-12-17 MED ORDER — METHOCARBAMOL 500 MG PO TABS: 500.0000 mg | ORAL_TABLET | Freq: Four times a day (QID) | ORAL | 0 refills | Status: DC | PRN

## 2018-12-17 MED ORDER — ASPIRIN 325 MG PO TBEC: 325.0000 mg | DELAYED_RELEASE_TABLET | Freq: Two times a day (BID) | ORAL | 0 refills | Status: DC

## 2018-12-17 MED ORDER — HYDROCODONE-ACETAMINOPHEN 5-325 MG PO TABS: 1.0000 | ORAL_TABLET | Freq: Four times a day (QID) | ORAL | 0 refills | Status: DC | PRN

## 2018-12-17 NOTE — Progress Notes (Signed)
SPORTS MEDICINE AND JOINT REPLACEMENT  Lara Mulch, MD    Carlyon Shadow, PA-C [address], [place], [place]  [id number]                             [phone]   PROGRESS NOTE  Subjective:  negative for Chest Pain  negative for Shortness of Breath  negative for Nausea/Vomiting   negative for Calf Pain  negative for Bowel Movement   Tolerating Diet: yes         Patient reports pain as 3 on 0-10 scale.    Objective: Vital signs in last 24 hours:    Patient Vitals for the past 24 hrs:  BP Temp Temp src Pulse Resp SpO2 Height Weight  12/17/18 0513 (!) 113/58 97.6 F (36.4 C) Oral (!) 54 16 98 % - -  12/17/18 0137 (!) 110/59 97.7 F (36.5 C) Oral (!) 53 16 97 % - -  12/16/18 2019 140/80 97.8 F (36.6 C) Oral (!) 55 16 100 % - -  12/16/18 1800 137/76 (!) 97.5 F (36.4 C) - 62 16 100 % - -  12/16/18 1432 125/67 97.7 F (36.5 C) Oral 74 16 100 % - -  12/16/18 1336 131/67 (!) 97.5 F (36.4 C) Oral 63 16 100 % - -  12/16/18 1227 120/72 97.7 F (36.5 C) Oral (!) 57 18 100 % - -  12/16/18 1135 122/75 97.6 F (36.4 C) - (!) 57 16 100 % - -  12/16/18 1135 122/75 - Oral (!) 57 16 100 % 5\' 9"  (1.753 m) 85 kg  12/16/18 1115 119/69 (!) 97.5 F (36.4 C) - (!) 55 12 100 % - -  12/16/18 1100 114/68 - - (!) 57 15 98 % - -  12/16/18 1045 117/68 - - (!) 55 13 99 % - -  12/16/18 1030 120/64 - - (!) 57 15 98 % - -  12/16/18 1015 110/68 - - 60 18 97 % - -  12/16/18 1000 116/68 - - (!) 56 13 99 % - -  12/16/18 0945 (!) 143/80 - - 64 15 99 % - -  12/16/18 0930 124/61 - - 61 16 100 % - -  12/16/18 0915 115/66 - - 62 10 100 % - -  12/16/18 0900 109/60 - - 67 17 100 % - -  12/16/18 0851 (!) 112/57 (!) 97.5 F (36.4 C) - 74 16 96 % - -  12/16/18 0706 114/68 - - 62 19 100 % - -  12/16/18 0705 - - - 60 16 99 % - -  12/16/18 0704 - - - (!) 52 (!) 0 100 % - -  12/16/18 0703 - - - 60 (!) 22 100 % - -  12/16/18 0702 - - - 65 18 100 % - -  12/16/18 0701 123/71 - - (!) 58 19 100 % - -   12/16/18 0700 - - - (!) 58 16 100 % - -  12/16/18 0659 - - - (!) 59 16 100 % - -  12/16/18 0658 - - - (!) 57 11 99 % - -  12/16/18 0655 (!) 108/59 - - - - - - -    @flow {1959:LAST@   Intake/Output from previous day:   05/11 0701 - 05/12 0700 In: 5090.5 [P.O.:1925; I.V.:2855.5] Out: 2745 [Urine:2725]   Intake/Output this shift:   05/11 1901 - 05/12 0700 In: 1940.3 [P.O.:960; I.V.:870.3] Out: 1025 [Urine:1025]  Intake/Output      05/11 0701 - 05/12 0700   P.O. 1925   I.V. (mL/kg) 2855.5 (33.6)   Other 10   IV Piggyback 300   Total Intake(mL/kg) 5090.5 (59.9)   Urine (mL/kg/hr) 2725 (1.3)   Emesis/NG output 0   Stool 0   Blood 20   Total Output 2745   Net +2345.5       Stool Occurrence 0 x   Emesis Occurrence 0 x      LABORATORY DATA: Recent Labs    12/12/18 1055 12/17/18 0344  WBC 4.1 11.2*  HGB 13.6 11.3*  HCT 41.5 33.7*  PLT 199 162   Recent Labs    12/12/18 1055 12/17/18 0344  NA 136 134*  K 4.6 4.2  CL 100 101  CO2 27 27  BUN 21 16  CREATININE 0.93 0.80  GLUCOSE 93 130*  CALCIUM 9.2 8.3*   No results found for: INR, PROTIME  Examination:  General appearance: alert, cooperative and no distress Extremities: extremities normal, atraumatic, no cyanosis or edema  Wound Exam: clean, dry, intact   Drainage:  None: wound tissue dry  Motor Exam: Quadriceps and Hamstrings Intact  Sensory Exam: Superficial Peroneal, Deep Peroneal and Tibial normal   Assessment:    1 Day Post-Op  Procedure(s) (LRB): TOTAL KNEE ARTHROPLASTY (Right)  ADDITIONAL DIAGNOSIS:  Active Problems:   S/P total knee replacement     Plan: Physical Therapy as ordered Weight Bearing as Tolerated (WBAT)  DVT Prophylaxis:  Aspirin  DISCHARGE PLAN: Home  DISCHARGE NEEDS: HHPT     Patient doing great. D/C home once cleared PT stair training this morning  Patient's anticipated LOS is less than 2 midnights, meeting these requirements: - Lives within 1 hour of  care - Has a competent adult at home to recover with post-op recover - NO history of  - Chronic pain requiring opiods  - Diabetes  - Coronary Artery Disease  - Heart failure  - Heart attack  - Stroke  - DVT/VTE  - Cardiac arrhythmia  - Respiratory Failure/COPD  - Renal failure  - Anemia  - Advanced Liver disease        Donia Ast 12/17/2018, 6:45 AM

## 2018-12-17 NOTE — Anesthesia Postprocedure Evaluation (Signed)
Anesthesia Post Note  Patient: Philip Preston  Procedure(s) Performed: TOTAL KNEE ARTHROPLASTY (Right Knee)     Patient location during evaluation: PACU Anesthesia Type: MAC and Spinal Level of consciousness: awake and alert Pain management: pain level controlled Vital Signs Assessment: post-procedure vital signs reviewed and stable Respiratory status: spontaneous breathing and respiratory function stable Cardiovascular status: blood pressure returned to baseline and stable Postop Assessment: spinal receding Anesthetic complications: no    Last Vitals:  Vitals:   12/17/18 0513 12/17/18 0953  BP: (!) 113/58 120/67  Pulse: (!) 54 (!) 56  Resp: 16 16  Temp: 36.4 C   SpO2: 98% 100%    Last Pain:  Vitals:   12/17/18 0608  TempSrc:   PainSc: 3                  Tiajuana Amass

## 2018-12-17 NOTE — Progress Notes (Signed)
Physical Therapy Treatment Patient Details Name: Rodrigus Kilker MRN: [id number] DOB: 1951/07/02 Today's Date: 12/17/2018    History of Present Illness 68 yo male s/p R TKR on 12/16/18. PMH includes L TKR 04/2018, L biceps tendon repair, R triceps tendon repair, lumar spine surgery.     PT Comments    Progressing well with mobility. Reviewed/practiced exercises, gait training, and stair training. All education completed. Okay to d/c from PT standpoint-made RN aware.    Follow Up Recommendations  Follow surgeon's recommendation for DC plan and follow-up therapies     Equipment Recommendations  None recommended by PT    Recommendations for Other Services       Precautions / Restrictions Precautions Precautions: Fall Restrictions Weight Bearing Restrictions: No Other Position/Activity Restrictions: WBAT     Mobility  Bed Mobility Overal bed mobility: Needs Assistance Bed Mobility: Supine to Sit;Sit to Supine     Supine to sit: Supervision Sit to supine: Supervision      Transfers Overall transfer level: Needs assistance Equipment used: Rolling walker (2 wheeled) Transfers: Sit to/from Stand Sit to Stand: Supervision         General transfer comment: VCs safety, hand placement  Ambulation/Gait Ambulation/Gait assistance: Supervision Gait Distance (Feet): 125 Feet Assistive device: Rolling walker (2 wheeled) Gait Pattern/deviations: Step-through pattern     General Gait Details: for safety.    Stairs Stairs: Yes Stairs assistance: Min guard Stair Management: Step to pattern;Forwards;With cane Number of Stairs: 2 General stair comments: up and over portable steps with a cane. VCs safety, technqiue, sequence. Close guard for safety.    Wheelchair Mobility    Modified Rankin (Stroke Patients Only)       Balance                                            Cognition Arousal/Alertness: Awake/alert Behavior During Therapy: WFL  for tasks assessed/performed Overall Cognitive Status: Within Functional Limits for tasks assessed                                        Exercises Total Joint Exercises Ankle Circles/Pumps: AROM;Both;10 reps;Supine Quad Sets: AROM;Both;10 reps;Supine Hip ABduction/ADduction: AROM;Right;10 reps;Supine Straight Leg Raises: AROM;Right;10 reps;Supine Long Arc Quad: AROM;Right;Supine Knee Flexion: AAROM;Right;10 reps;Seated Goniometric ROM: ~5-75 degrees    General Comments        Pertinent Vitals/Pain Pain Assessment: 0-10 Pain Score: 4  Pain Location: R knee  Pain Descriptors / Indicators: Sore Pain Intervention(s): Monitored during session;Ice applied;Repositioned    Home Living                      Prior Function            PT Goals (current goals can now be found in the care plan section) Progress towards PT goals: Progressing toward goals    Frequency    7X/week      PT Plan      Co-evaluation              AM-PAC PT "6 Clicks" Mobility   Outcome Measure  Help needed turning from your back to your side while in a flat bed without using bedrails?: None Help needed moving from lying on your back to sitting on the side of  a flat bed without using bedrails?: A Little Help needed moving to and from a bed to a chair (including a wheelchair)?: A Little Help needed standing up from a chair using your arms (e.g., wheelchair or bedside chair)?: A Little Help needed to walk in hospital room?: A Little Help needed climbing 3-5 steps with a railing? : A Little 6 Click Score: 19    End of Session Equipment Utilized During Treatment: Gait belt Activity Tolerance: Patient tolerated treatment well Patient left: in bed;with call bell/phone within reach   PT Visit Diagnosis: Other abnormalities of gait and mobility (R26.89)     Time: 6859-9234 PT Time Calculation (min) (ACUTE ONLY): 20 min  Charges:  $Gait Training: 8-22 mins                         Weston Anna, [place] Pager: [phone] Office: [phone]

## 2018-12-20 DIAGNOSIS — M62551 Muscle wasting and atrophy, not elsewhere classified, right thigh: Secondary | ICD-10-CM | POA: Diagnosis not present

## 2018-12-20 DIAGNOSIS — M25561 Pain in right knee: Secondary | ICD-10-CM | POA: Diagnosis not present

## 2018-12-20 DIAGNOSIS — R2689 Other abnormalities of gait and mobility: Secondary | ICD-10-CM | POA: Diagnosis not present

## 2018-12-20 DIAGNOSIS — Z96651 Presence of right artificial knee joint: Secondary | ICD-10-CM | POA: Diagnosis not present

## 2018-12-20 DIAGNOSIS — M25461 Effusion, right knee: Secondary | ICD-10-CM | POA: Diagnosis not present

## 2018-12-23 ENCOUNTER — Encounter (HOSPITAL_COMMUNITY): Admission: RE | Payer: Self-pay | Source: Other Acute Inpatient Hospital

## 2018-12-23 ENCOUNTER — Ambulatory Visit (HOSPITAL_COMMUNITY): Admission: RE | Admit: 2018-12-23 | Payer: PPO | Source: Other Acute Inpatient Hospital | Admitting: Orthopedic Surgery

## 2018-12-23 DIAGNOSIS — R2689 Other abnormalities of gait and mobility: Secondary | ICD-10-CM | POA: Diagnosis not present

## 2018-12-23 DIAGNOSIS — M25561 Pain in right knee: Secondary | ICD-10-CM | POA: Diagnosis not present

## 2018-12-23 DIAGNOSIS — M62551 Muscle wasting and atrophy, not elsewhere classified, right thigh: Secondary | ICD-10-CM | POA: Diagnosis not present

## 2018-12-23 DIAGNOSIS — M25461 Effusion, right knee: Secondary | ICD-10-CM | POA: Diagnosis not present

## 2018-12-23 DIAGNOSIS — Z96651 Presence of right artificial knee joint: Secondary | ICD-10-CM | POA: Diagnosis not present

## 2018-12-23 SURGERY — ARTHROPLASTY, KNEE, TOTAL
Anesthesia: Spinal | Laterality: Right

## 2018-12-26 DIAGNOSIS — R2689 Other abnormalities of gait and mobility: Secondary | ICD-10-CM | POA: Diagnosis not present

## 2018-12-26 DIAGNOSIS — Z96651 Presence of right artificial knee joint: Secondary | ICD-10-CM | POA: Diagnosis not present

## 2018-12-26 DIAGNOSIS — M62551 Muscle wasting and atrophy, not elsewhere classified, right thigh: Secondary | ICD-10-CM | POA: Diagnosis not present

## 2018-12-26 DIAGNOSIS — M25561 Pain in right knee: Secondary | ICD-10-CM | POA: Diagnosis not present

## 2018-12-26 DIAGNOSIS — M25461 Effusion, right knee: Secondary | ICD-10-CM | POA: Diagnosis not present

## 2018-12-30 DIAGNOSIS — M25461 Effusion, right knee: Secondary | ICD-10-CM | POA: Diagnosis not present

## 2018-12-30 DIAGNOSIS — R2689 Other abnormalities of gait and mobility: Secondary | ICD-10-CM | POA: Diagnosis not present

## 2018-12-30 DIAGNOSIS — M25561 Pain in right knee: Secondary | ICD-10-CM | POA: Diagnosis not present

## 2018-12-30 DIAGNOSIS — M62551 Muscle wasting and atrophy, not elsewhere classified, right thigh: Secondary | ICD-10-CM | POA: Diagnosis not present

## 2018-12-30 DIAGNOSIS — Z96651 Presence of right artificial knee joint: Secondary | ICD-10-CM | POA: Diagnosis not present

## 2019-01-01 NOTE — Discharge Summary (Signed)
SPORTS MEDICINE & JOINT REPLACEMENT   Lara Mulch, MD   Carlyon Shadow, PA-C [address], [place], [place]  [id number]                             [phone]  PATIENT ID: Philip Preston        MRN:  [id number]          DOB/AGE: [birth year] / 68 y.o.    DISCHARGE SUMMARY  ADMISSION DATE:    12/16/2018 DISCHARGE DATE:   12/16/2018  ADMISSION DIAGNOSIS: RIGHT KNEE OSTEOARTHRITIS    DISCHARGE DIAGNOSIS:  RIGHT KNEE OSTEOARTHRITIS    ADDITIONAL DIAGNOSIS: Active Problems:   S/P total knee replacement  Past Medical History:  Diagnosis Date  . BPH associated with nocturia   . ED (erectile dysfunction)   . OA (osteoarthritis)    knees,   . Wears glasses     PROCEDURE: Procedure(s): TOTAL KNEE ARTHROPLASTY on 12/16/2018  CONSULTS:    HISTORY:  See H&P in chart  HOSPITAL COURSE:  Kailin Leu is a 68 y.o. admitted on 12/16/2018 and found to have a diagnosis of [place].  After appropriate laboratory studies were obtained  they were taken to the operating room on 12/16/2018 and underwent Procedure(s): TOTAL KNEE ARTHROPLASTY.   They were given perioperative antibiotics:  Anti-infectives (From admission, onward)   Start     Dose/Rate Route Frequency Ordered Stop   12/16/18 1400  ceFAZolin (ANCEF) IVPB 2g/100 mL premix     2 g 200 mL/hr over 30 Minutes Intravenous Every 6 hours 12/16/18 1132 12/16/18 2129   12/16/18 0600  ceFAZolin (ANCEF) IVPB 2g/100 mL premix     2 g 200 mL/hr over 30 Minutes Intravenous On call to O.R. 12/16/18 0530 12/16/18 0749    .  Patient given tranexamic acid IV or topical and exparel intra-operatively.  Tolerated the procedure well.    POD# 1: Vital signs were stable.  Patient denied Chest pain, shortness of breath, or calf pain.  Patient was started on Aspirin twice daily at 8am.  Consults to PT, OT, and care management were made.  The patient was weight bearing as tolerated.  CPM was placed on the operative  leg 0-90 degrees for 6-8 hours a day. When out of the CPM, patient was placed in the foam block to achieve full extension. Incentive spirometry was taught.  Dressing was changed.       POD #2, Continued  PT for ambulation and exercise program.  IV saline locked.  O2 discontinued.    The remainder of the hospital course was dedicated to ambulation and strengthening.   The patient was discharged on 16 Days Post-Op in  Good condition.  Blood products given:none  DIAGNOSTIC STUDIES: Recent vital signs: No data found.     Recent laboratory studies: No results for input(s): WBC, HGB, HCT, PLT in the last 168 hours. No results for input(s): NA, K, CL, CO2, BUN, CREATININE, GLUCOSE, CALCIUM in the last 168 hours. No results found for: INR, PROTIME   Recent Radiographic Studies :  No results found.  DISCHARGE INSTRUCTIONS: Discharge Instructions    Call MD / Call 911   Complete by:  As directed    If you experience chest pain or shortness of breath, CALL 911 and be transported to the hospital emergency room.  If you develope a fever above 101 F, pus (white drainage) or increased drainage or redness  at the wound, or calf pain, call your surgeon's office.   Constipation Prevention   Complete by:  As directed    Drink plenty of fluids.  Prune juice may be helpful.  You may use a stool softener, such as Colace (over the counter) 100 mg twice a day.  Use MiraLax (over the counter) for constipation as needed.   Diet - low sodium heart healthy   Complete by:  As directed    Discharge instructions   Complete by:  As directed    INSTRUCTIONS AFTER JOINT REPLACEMENT   Remove items at home which could result in a fall. This includes throw rugs or furniture in walking pathways ICE to the affected joint every three hours while awake for 30 minutes at a time, for at least the first 3-5 days, and then as needed for pain and swelling.  Continue to use ice for pain and swelling. You may notice swelling  that will progress down to the foot and ankle.  This is normal after surgery.  Elevate your leg when you are not up walking on it.   Continue to use the breathing machine you got in the hospital (incentive spirometer) which will help keep your temperature down.  It is common for your temperature to cycle up and down following surgery, especially at night when you are not up moving around and exerting yourself.  The breathing machine keeps your lungs expanded and your temperature down.   DIET:  As you were doing prior to hospitalization, we recommend a well-balanced diet.  DRESSING / WOUND CARE / SHOWERING  Keep the surgical dressing until follow up.  The dressing is water proof, so you can shower without any extra covering.  IF THE DRESSING FALLS OFF or the wound gets wet inside, change the dressing with sterile gauze.  Please use good hand washing techniques before changing the dressing.  Do not use any lotions or creams on the incision until instructed by your surgeon.    ACTIVITY  Increase activity slowly as tolerated, but follow the weight bearing instructions below.   No driving for 6 weeks or until further direction given by your physician.  You cannot drive while taking narcotics.  No lifting or carrying greater than 10 lbs. until further directed by your surgeon. Avoid periods of inactivity such as sitting longer than an hour when not asleep. This helps prevent blood clots.  You may return to work once you are authorized by your doctor.     WEIGHT BEARING   Weight bearing as tolerated with assist device (walker, cane, etc) as directed, use it as long as suggested by your surgeon or therapist, typically at least 4-6 weeks.   EXERCISES  Results after joint replacement surgery are often greatly improved when you follow the exercise, range of motion and muscle strengthening exercises prescribed by your doctor. Safety measures are also important to protect the joint from further injury.  Any time any of these exercises cause you to have increased pain or swelling, decrease what you are doing until you are comfortable again and then slowly increase them. If you have problems or questions, call your caregiver or physical therapist for advice.   Rehabilitation is important following a joint replacement. After just a few days of immobilization, the muscles of the leg can become weakened and shrink (atrophy).  These exercises are designed to build up the tone and strength of the thigh and leg muscles and to improve motion. Often times heat used  for twenty to thirty minutes before working out will loosen up your tissues and help with improving the range of motion but do not use heat for the first two weeks following surgery (sometimes heat can increase post-operative swelling).   These exercises can be done on a training (exercise) mat, on the floor, on a table or on a bed. Use whatever works the best and is most comfortable for you.    Use music or television while you are exercising so that the exercises are a pleasant break in your day. This will make your life better with the exercises acting as a break in your routine that you can look forward to.   Perform all exercises about fifteen times, three times per day or as directed.  You should exercise both the operative leg and the other leg as well.   Exercises include:   Quad Sets - Tighten up the muscle on the front of the thigh (Quad) and hold for 5-10 seconds.   Straight Leg Raises - With your knee straight (if you were given a brace, keep it on), lift the leg to 60 degrees, hold for 3 seconds, and slowly lower the leg.  Perform this exercise against resistance later as your leg gets stronger.  Leg Slides: Lying on your back, slowly slide your foot toward your buttocks, bending your knee up off the floor (only go as far as is comfortable). Then slowly slide your foot back down until your leg is flat on the floor again.  Angel Wings: Lying  on your back spread your legs to the side as far apart as you can without causing discomfort.  Hamstring Strength:  Lying on your back, push your heel against the floor with your leg straight by tightening up the muscles of your buttocks.  Repeat, but this time bend your knee to a comfortable angle, and push your heel against the floor.  You may put a pillow under the heel to make it more comfortable if necessary.   A rehabilitation program following joint replacement surgery can speed recovery and prevent re-injury in the future due to weakened muscles. Contact your doctor or a physical therapist for more information on knee rehabilitation.    CONSTIPATION  Constipation is defined medically as fewer than three stools per week and severe constipation as less than one stool per week.  Even if you have a regular bowel pattern at home, your normal regimen is likely to be disrupted due to multiple reasons following surgery.  Combination of anesthesia, postoperative narcotics, change in appetite and fluid intake all can affect your bowels.   YOU MUST use at least one of the following options; they are listed in order of increasing strength to get the job done.  They are all available over the counter, and you may need to use some, POSSIBLY even all of these options:    Drink plenty of fluids (prune juice may be helpful) and high fiber foods Colace 100 mg by mouth twice a day  Senokot for constipation as directed and as needed Dulcolax (bisacodyl), take with full glass of water  Miralax (polyethylene glycol) once or twice a day as needed.  If you have tried all these things and are unable to have a bowel movement in the first 3-4 days after surgery call either your surgeon or your primary doctor.    If you experience loose stools or diarrhea, hold the medications until you stool forms back up.  If your symptoms do  not get better within 1 week or if they get worse, check with your doctor.  If you  experience "the worst abdominal pain ever" or develop nausea or vomiting, please contact the office immediately for further recommendations for treatment.   ITCHING:  If you experience itching with your medications, try taking only a single pain pill, or even half a pain pill at a time.  You can also use Benadryl over the counter for itching or also to help with sleep.   TED HOSE STOCKINGS:  Use stockings on both legs until for at least 2 weeks or as directed by physician office. They may be removed at night for sleeping.  MEDICATIONS:  See your medication summary on the "After Visit Summary" that nursing will review with you.  You may have some home medications which will be placed on hold until you complete the course of blood thinner medication.  It is important for you to complete the blood thinner medication as prescribed.  PRECAUTIONS:  If you experience chest pain or shortness of breath - call 911 immediately for transfer to the hospital emergency department.   If you develop a fever greater that 101 F, purulent drainage from wound, increased redness or drainage from wound, foul odor from the wound/dressing, or calf pain - CONTACT YOUR SURGEON.                                                   FOLLOW-UP APPOINTMENTS:  If you do not already have a post-op appointment, please call the office for an appointment to be seen by your surgeon.  Guidelines for how soon to be seen are listed in your "After Visit Summary", but are typically between 1-4 weeks after surgery.  OTHER INSTRUCTIONS:   Knee Replacement:  Do not place pillow under knee, focus on keeping the knee straight while resting. CPM instructions: 0-90 degrees, 2 hours in the morning, 2 hours in the afternoon, and 2 hours in the evening. Place foam block, curve side up under heel at all times except when in CPM or when walking.  DO NOT modify, tear, cut, or change the foam block in any way.  MAKE SURE YOU:  Understand these  instructions.  Get help right away if you are not doing well or get worse.    Thank you for letting us be a part of your medical care team.  It is a privilege we respect greatly.  We hope these instructions will help you stay on track for a fast and full recovery!   Increase activity slowly as tolerated   Complete by:  As directed       DISCHARGE MEDICATIONS:   Allergies as of 12/17/2018      Reactions   Levofloxacin Rash   Tore tendons       Medication List    TAKE these medications   acetaminophen 500 MG tablet Commonly known as:  TYLENOL Take 2 tablets (1,000 mg total) by mouth every 6 (six) hours. What changed:    when to take this  reasons to take this   aspirin 325 MG EC tablet Take 1 tablet (325 mg total) by mouth 2 (two) times daily. What changed:    medication strength  how much to take  when to take this   Fish Oil 1000 MG Caps Take 1,000 mg  by mouth daily.   Glucosamine Chondroitin Triple Tabs Take 1 tablet by mouth daily.   HYDROcodone-acetaminophen 5-325 MG tablet Commonly known as:  NORCO/VICODIN Take 1-2 tablets by mouth every 6 (six) hours as needed for moderate pain.   methocarbamol 500 MG tablet Commonly known as:  ROBAXIN Take 1-2 tablets (500-1,000 mg total) by mouth every 6 (six) hours as needed for muscle spasms.   multivitamin with minerals Tabs tablet Take 1 tablet by mouth daily.   vitamin C 1000 MG tablet Take 1,000 mg by mouth daily.       FOLLOW UP VISIT:    DISPOSITION: HOME VS. SNF  CONDITION:  Good   Donia Ast 01/01/2019, 9:26 AM

## 2019-01-02 DIAGNOSIS — R2689 Other abnormalities of gait and mobility: Secondary | ICD-10-CM | POA: Diagnosis not present

## 2019-01-02 DIAGNOSIS — M25561 Pain in right knee: Secondary | ICD-10-CM | POA: Diagnosis not present

## 2019-01-02 DIAGNOSIS — M25461 Effusion, right knee: Secondary | ICD-10-CM | POA: Diagnosis not present

## 2019-01-02 DIAGNOSIS — Z96651 Presence of right artificial knee joint: Secondary | ICD-10-CM | POA: Diagnosis not present

## 2019-01-02 DIAGNOSIS — M62551 Muscle wasting and atrophy, not elsewhere classified, right thigh: Secondary | ICD-10-CM | POA: Diagnosis not present

## 2019-01-06 DIAGNOSIS — M25461 Effusion, right knee: Secondary | ICD-10-CM | POA: Diagnosis not present

## 2019-01-06 DIAGNOSIS — Z96651 Presence of right artificial knee joint: Secondary | ICD-10-CM | POA: Diagnosis not present

## 2019-01-06 DIAGNOSIS — R2689 Other abnormalities of gait and mobility: Secondary | ICD-10-CM | POA: Diagnosis not present

## 2019-01-06 DIAGNOSIS — M62551 Muscle wasting and atrophy, not elsewhere classified, right thigh: Secondary | ICD-10-CM | POA: Diagnosis not present

## 2019-01-06 DIAGNOSIS — M25561 Pain in right knee: Secondary | ICD-10-CM | POA: Diagnosis not present

## 2019-01-09 DIAGNOSIS — M25461 Effusion, right knee: Secondary | ICD-10-CM | POA: Diagnosis not present

## 2019-01-09 DIAGNOSIS — R2689 Other abnormalities of gait and mobility: Secondary | ICD-10-CM | POA: Diagnosis not present

## 2019-01-09 DIAGNOSIS — M25561 Pain in right knee: Secondary | ICD-10-CM | POA: Diagnosis not present

## 2019-01-09 DIAGNOSIS — M62551 Muscle wasting and atrophy, not elsewhere classified, right thigh: Secondary | ICD-10-CM | POA: Diagnosis not present

## 2019-01-09 DIAGNOSIS — Z96651 Presence of right artificial knee joint: Secondary | ICD-10-CM | POA: Diagnosis not present

## 2019-01-13 DIAGNOSIS — M62551 Muscle wasting and atrophy, not elsewhere classified, right thigh: Secondary | ICD-10-CM | POA: Diagnosis not present

## 2019-01-13 DIAGNOSIS — Z96651 Presence of right artificial knee joint: Secondary | ICD-10-CM | POA: Diagnosis not present

## 2019-01-13 DIAGNOSIS — M25561 Pain in right knee: Secondary | ICD-10-CM | POA: Diagnosis not present

## 2019-01-13 DIAGNOSIS — M25461 Effusion, right knee: Secondary | ICD-10-CM | POA: Diagnosis not present

## 2019-01-13 DIAGNOSIS — R2689 Other abnormalities of gait and mobility: Secondary | ICD-10-CM | POA: Diagnosis not present

## 2019-01-16 DIAGNOSIS — Z96651 Presence of right artificial knee joint: Secondary | ICD-10-CM | POA: Diagnosis not present

## 2019-01-16 DIAGNOSIS — M25461 Effusion, right knee: Secondary | ICD-10-CM | POA: Diagnosis not present

## 2019-01-16 DIAGNOSIS — M25561 Pain in right knee: Secondary | ICD-10-CM | POA: Diagnosis not present

## 2019-01-16 DIAGNOSIS — R2689 Other abnormalities of gait and mobility: Secondary | ICD-10-CM | POA: Diagnosis not present

## 2019-01-16 DIAGNOSIS — M62551 Muscle wasting and atrophy, not elsewhere classified, right thigh: Secondary | ICD-10-CM | POA: Diagnosis not present

## 2019-01-20 DIAGNOSIS — Z96651 Presence of right artificial knee joint: Secondary | ICD-10-CM | POA: Diagnosis not present

## 2019-01-20 DIAGNOSIS — M25461 Effusion, right knee: Secondary | ICD-10-CM | POA: Diagnosis not present

## 2019-01-20 DIAGNOSIS — R2689 Other abnormalities of gait and mobility: Secondary | ICD-10-CM | POA: Diagnosis not present

## 2019-01-20 DIAGNOSIS — M25561 Pain in right knee: Secondary | ICD-10-CM | POA: Diagnosis not present

## 2019-01-20 DIAGNOSIS — M62551 Muscle wasting and atrophy, not elsewhere classified, right thigh: Secondary | ICD-10-CM | POA: Diagnosis not present

## 2019-01-23 DIAGNOSIS — R2689 Other abnormalities of gait and mobility: Secondary | ICD-10-CM | POA: Diagnosis not present

## 2019-01-23 DIAGNOSIS — M62551 Muscle wasting and atrophy, not elsewhere classified, right thigh: Secondary | ICD-10-CM | POA: Diagnosis not present

## 2019-01-23 DIAGNOSIS — M25561 Pain in right knee: Secondary | ICD-10-CM | POA: Diagnosis not present

## 2019-01-23 DIAGNOSIS — M25461 Effusion, right knee: Secondary | ICD-10-CM | POA: Diagnosis not present

## 2019-01-23 DIAGNOSIS — Z96651 Presence of right artificial knee joint: Secondary | ICD-10-CM | POA: Diagnosis not present

## 2019-06-23 DIAGNOSIS — D649 Anemia, unspecified: Secondary | ICD-10-CM | POA: Diagnosis not present

## 2019-06-23 DIAGNOSIS — N529 Male erectile dysfunction, unspecified: Secondary | ICD-10-CM | POA: Diagnosis not present

## 2019-06-23 DIAGNOSIS — Z125 Encounter for screening for malignant neoplasm of prostate: Secondary | ICD-10-CM | POA: Diagnosis not present

## 2019-06-23 DIAGNOSIS — N400 Enlarged prostate without lower urinary tract symptoms: Secondary | ICD-10-CM | POA: Diagnosis not present

## 2019-06-23 DIAGNOSIS — R251 Tremor, unspecified: Secondary | ICD-10-CM | POA: Diagnosis not present

## 2019-06-23 DIAGNOSIS — R7301 Impaired fasting glucose: Secondary | ICD-10-CM | POA: Diagnosis not present

## 2019-06-23 DIAGNOSIS — E782 Mixed hyperlipidemia: Secondary | ICD-10-CM | POA: Diagnosis not present

## 2019-06-23 DIAGNOSIS — Z0000 Encounter for general adult medical examination without abnormal findings: Secondary | ICD-10-CM | POA: Diagnosis not present

## 2019-06-23 DIAGNOSIS — M720 Palmar fascial fibromatosis [Dupuytren]: Secondary | ICD-10-CM | POA: Diagnosis not present

## 2019-06-23 DIAGNOSIS — M8949 Other hypertrophic osteoarthropathy, multiple sites: Secondary | ICD-10-CM | POA: Diagnosis not present

## 2019-08-07 DIAGNOSIS — M9903 Segmental and somatic dysfunction of lumbar region: Secondary | ICD-10-CM | POA: Diagnosis not present

## 2019-08-07 DIAGNOSIS — M546 Pain in thoracic spine: Secondary | ICD-10-CM | POA: Diagnosis not present

## 2019-08-07 DIAGNOSIS — M9901 Segmental and somatic dysfunction of cervical region: Secondary | ICD-10-CM | POA: Diagnosis not present

## 2019-08-07 DIAGNOSIS — M4012 Other secondary kyphosis, cervical region: Secondary | ICD-10-CM | POA: Diagnosis not present

## 2019-08-07 DIAGNOSIS — M9902 Segmental and somatic dysfunction of thoracic region: Secondary | ICD-10-CM | POA: Diagnosis not present

## 2019-08-13 DIAGNOSIS — M546 Pain in thoracic spine: Secondary | ICD-10-CM | POA: Diagnosis not present

## 2019-08-13 DIAGNOSIS — M9902 Segmental and somatic dysfunction of thoracic region: Secondary | ICD-10-CM | POA: Diagnosis not present

## 2019-08-13 DIAGNOSIS — M4012 Other secondary kyphosis, cervical region: Secondary | ICD-10-CM | POA: Diagnosis not present

## 2019-08-13 DIAGNOSIS — M9901 Segmental and somatic dysfunction of cervical region: Secondary | ICD-10-CM | POA: Diagnosis not present

## 2019-08-13 DIAGNOSIS — M9903 Segmental and somatic dysfunction of lumbar region: Secondary | ICD-10-CM | POA: Diagnosis not present

## 2019-08-27 DIAGNOSIS — M9903 Segmental and somatic dysfunction of lumbar region: Secondary | ICD-10-CM | POA: Diagnosis not present

## 2019-08-27 DIAGNOSIS — M4012 Other secondary kyphosis, cervical region: Secondary | ICD-10-CM | POA: Diagnosis not present

## 2019-08-27 DIAGNOSIS — M9902 Segmental and somatic dysfunction of thoracic region: Secondary | ICD-10-CM | POA: Diagnosis not present

## 2019-08-27 DIAGNOSIS — M9901 Segmental and somatic dysfunction of cervical region: Secondary | ICD-10-CM | POA: Diagnosis not present

## 2019-08-27 DIAGNOSIS — M546 Pain in thoracic spine: Secondary | ICD-10-CM | POA: Diagnosis not present

## 2019-11-11 DIAGNOSIS — D229 Melanocytic nevi, unspecified: Secondary | ICD-10-CM | POA: Diagnosis not present

## 2019-11-11 DIAGNOSIS — L570 Actinic keratosis: Secondary | ICD-10-CM | POA: Diagnosis not present

## 2019-12-18 DIAGNOSIS — Z96651 Presence of right artificial knee joint: Secondary | ICD-10-CM | POA: Diagnosis not present

## 2019-12-18 DIAGNOSIS — Z96652 Presence of left artificial knee joint: Secondary | ICD-10-CM | POA: Diagnosis not present

## 2019-12-18 DIAGNOSIS — Z471 Aftercare following joint replacement surgery: Secondary | ICD-10-CM | POA: Diagnosis not present

## 2020-02-16 DIAGNOSIS — L570 Actinic keratosis: Secondary | ICD-10-CM | POA: Diagnosis not present

## 2020-02-16 DIAGNOSIS — L578 Other skin changes due to chronic exposure to nonionizing radiation: Secondary | ICD-10-CM | POA: Diagnosis not present

## 2020-03-02 DIAGNOSIS — H903 Sensorineural hearing loss, bilateral: Secondary | ICD-10-CM | POA: Diagnosis not present

## 2020-03-30 DIAGNOSIS — R1013 Epigastric pain: Secondary | ICD-10-CM | POA: Diagnosis not present

## 2020-03-31 DIAGNOSIS — R1013 Epigastric pain: Secondary | ICD-10-CM | POA: Diagnosis not present

## 2020-03-31 DIAGNOSIS — Z791 Long term (current) use of non-steroidal anti-inflammatories (NSAID): Secondary | ICD-10-CM | POA: Diagnosis not present

## 2020-03-31 DIAGNOSIS — K2901 Acute gastritis with bleeding: Secondary | ICD-10-CM | POA: Diagnosis not present

## 2020-03-31 DIAGNOSIS — K2951 Unspecified chronic gastritis with bleeding: Secondary | ICD-10-CM | POA: Diagnosis not present

## 2020-03-31 DIAGNOSIS — K2930 Chronic superficial gastritis without bleeding: Secondary | ICD-10-CM | POA: Diagnosis not present

## 2020-04-02 DIAGNOSIS — R109 Unspecified abdominal pain: Secondary | ICD-10-CM | POA: Diagnosis not present

## 2020-04-02 DIAGNOSIS — R1013 Epigastric pain: Secondary | ICD-10-CM | POA: Diagnosis not present

## 2020-04-02 DIAGNOSIS — Z9049 Acquired absence of other specified parts of digestive tract: Secondary | ICD-10-CM | POA: Diagnosis not present

## 2020-05-18 DIAGNOSIS — M9902 Segmental and somatic dysfunction of thoracic region: Secondary | ICD-10-CM | POA: Diagnosis not present

## 2020-05-18 DIAGNOSIS — M9901 Segmental and somatic dysfunction of cervical region: Secondary | ICD-10-CM | POA: Diagnosis not present

## 2020-05-18 DIAGNOSIS — M546 Pain in thoracic spine: Secondary | ICD-10-CM | POA: Diagnosis not present

## 2020-05-18 DIAGNOSIS — M9903 Segmental and somatic dysfunction of lumbar region: Secondary | ICD-10-CM | POA: Diagnosis not present

## 2020-05-18 DIAGNOSIS — K2901 Acute gastritis with bleeding: Secondary | ICD-10-CM | POA: Diagnosis not present

## 2020-05-18 DIAGNOSIS — M4012 Other secondary kyphosis, cervical region: Secondary | ICD-10-CM | POA: Diagnosis not present

## 2020-06-15 DIAGNOSIS — M9903 Segmental and somatic dysfunction of lumbar region: Secondary | ICD-10-CM | POA: Diagnosis not present

## 2020-06-15 DIAGNOSIS — M546 Pain in thoracic spine: Secondary | ICD-10-CM | POA: Diagnosis not present

## 2020-06-15 DIAGNOSIS — M4012 Other secondary kyphosis, cervical region: Secondary | ICD-10-CM | POA: Diagnosis not present

## 2020-06-15 DIAGNOSIS — M9901 Segmental and somatic dysfunction of cervical region: Secondary | ICD-10-CM | POA: Diagnosis not present

## 2020-06-15 DIAGNOSIS — M9902 Segmental and somatic dysfunction of thoracic region: Secondary | ICD-10-CM | POA: Diagnosis not present

## 2020-06-15 DIAGNOSIS — M5451 Vertebrogenic low back pain: Secondary | ICD-10-CM | POA: Diagnosis not present

## 2020-06-24 DIAGNOSIS — N400 Enlarged prostate without lower urinary tract symptoms: Secondary | ICD-10-CM | POA: Diagnosis not present

## 2020-06-24 DIAGNOSIS — E782 Mixed hyperlipidemia: Secondary | ICD-10-CM | POA: Diagnosis not present

## 2020-06-24 DIAGNOSIS — Z8711 Personal history of peptic ulcer disease: Secondary | ICD-10-CM | POA: Diagnosis not present

## 2020-06-24 DIAGNOSIS — M8949 Other hypertrophic osteoarthropathy, multiple sites: Secondary | ICD-10-CM | POA: Diagnosis not present

## 2020-06-24 DIAGNOSIS — J310 Chronic rhinitis: Secondary | ICD-10-CM | POA: Diagnosis not present

## 2020-06-24 DIAGNOSIS — Z125 Encounter for screening for malignant neoplasm of prostate: Secondary | ICD-10-CM | POA: Diagnosis not present

## 2020-06-24 DIAGNOSIS — Z0000 Encounter for general adult medical examination without abnormal findings: Secondary | ICD-10-CM | POA: Diagnosis not present

## 2020-06-24 DIAGNOSIS — N529 Male erectile dysfunction, unspecified: Secondary | ICD-10-CM | POA: Diagnosis not present

## 2020-06-24 DIAGNOSIS — N486 Induration penis plastica: Secondary | ICD-10-CM | POA: Diagnosis not present

## 2020-06-24 DIAGNOSIS — R7301 Impaired fasting glucose: Secondary | ICD-10-CM | POA: Diagnosis not present

## 2020-06-24 DIAGNOSIS — R3129 Other microscopic hematuria: Secondary | ICD-10-CM | POA: Diagnosis not present

## 2020-07-19 DIAGNOSIS — R0981 Nasal congestion: Secondary | ICD-10-CM | POA: Diagnosis not present

## 2020-07-19 DIAGNOSIS — Z20828 Contact with and (suspected) exposure to other viral communicable diseases: Secondary | ICD-10-CM | POA: Diagnosis not present

## 2020-07-19 DIAGNOSIS — R051 Acute cough: Secondary | ICD-10-CM | POA: Diagnosis not present

## 2020-07-19 DIAGNOSIS — J209 Acute bronchitis, unspecified: Secondary | ICD-10-CM | POA: Diagnosis not present

## 2020-07-22 DIAGNOSIS — M4012 Other secondary kyphosis, cervical region: Secondary | ICD-10-CM | POA: Diagnosis not present

## 2020-07-22 DIAGNOSIS — M9903 Segmental and somatic dysfunction of lumbar region: Secondary | ICD-10-CM | POA: Diagnosis not present

## 2020-07-22 DIAGNOSIS — M9902 Segmental and somatic dysfunction of thoracic region: Secondary | ICD-10-CM | POA: Diagnosis not present

## 2020-07-22 DIAGNOSIS — M9901 Segmental and somatic dysfunction of cervical region: Secondary | ICD-10-CM | POA: Diagnosis not present

## 2020-07-22 DIAGNOSIS — M5451 Vertebrogenic low back pain: Secondary | ICD-10-CM | POA: Diagnosis not present

## 2020-07-22 DIAGNOSIS — M546 Pain in thoracic spine: Secondary | ICD-10-CM | POA: Diagnosis not present

## 2020-07-26 DIAGNOSIS — M4012 Other secondary kyphosis, cervical region: Secondary | ICD-10-CM | POA: Diagnosis not present

## 2020-07-26 DIAGNOSIS — M5451 Vertebrogenic low back pain: Secondary | ICD-10-CM | POA: Diagnosis not present

## 2020-07-26 DIAGNOSIS — M546 Pain in thoracic spine: Secondary | ICD-10-CM | POA: Diagnosis not present

## 2020-07-26 DIAGNOSIS — M9903 Segmental and somatic dysfunction of lumbar region: Secondary | ICD-10-CM | POA: Diagnosis not present

## 2020-07-26 DIAGNOSIS — M9901 Segmental and somatic dysfunction of cervical region: Secondary | ICD-10-CM | POA: Diagnosis not present

## 2020-07-26 DIAGNOSIS — M9902 Segmental and somatic dysfunction of thoracic region: Secondary | ICD-10-CM | POA: Diagnosis not present

## 2020-08-04 DIAGNOSIS — M4012 Other secondary kyphosis, cervical region: Secondary | ICD-10-CM | POA: Diagnosis not present

## 2020-08-04 DIAGNOSIS — M9902 Segmental and somatic dysfunction of thoracic region: Secondary | ICD-10-CM | POA: Diagnosis not present

## 2020-08-04 DIAGNOSIS — M546 Pain in thoracic spine: Secondary | ICD-10-CM | POA: Diagnosis not present

## 2020-08-04 DIAGNOSIS — M9901 Segmental and somatic dysfunction of cervical region: Secondary | ICD-10-CM | POA: Diagnosis not present

## 2020-08-04 DIAGNOSIS — M5451 Vertebrogenic low back pain: Secondary | ICD-10-CM | POA: Diagnosis not present

## 2020-08-04 DIAGNOSIS — M9903 Segmental and somatic dysfunction of lumbar region: Secondary | ICD-10-CM | POA: Diagnosis not present

## 2020-09-15 DIAGNOSIS — C44622 Squamous cell carcinoma of skin of right upper limb, including shoulder: Secondary | ICD-10-CM | POA: Diagnosis not present

## 2020-09-15 DIAGNOSIS — D485 Neoplasm of uncertain behavior of skin: Secondary | ICD-10-CM | POA: Diagnosis not present

## 2020-09-15 DIAGNOSIS — L570 Actinic keratosis: Secondary | ICD-10-CM | POA: Diagnosis not present

## 2020-09-15 DIAGNOSIS — L821 Other seborrheic keratosis: Secondary | ICD-10-CM | POA: Diagnosis not present

## 2020-09-15 DIAGNOSIS — D229 Melanocytic nevi, unspecified: Secondary | ICD-10-CM | POA: Diagnosis not present

## 2020-10-11 DIAGNOSIS — D0461 Carcinoma in situ of skin of right upper limb, including shoulder: Secondary | ICD-10-CM | POA: Diagnosis not present

## 2020-11-02 DIAGNOSIS — N528 Other male erectile dysfunction: Secondary | ICD-10-CM | POA: Diagnosis not present

## 2020-11-02 DIAGNOSIS — N521 Erectile dysfunction due to diseases classified elsewhere: Secondary | ICD-10-CM | POA: Diagnosis not present

## 2020-11-02 DIAGNOSIS — N486 Induration penis plastica: Secondary | ICD-10-CM | POA: Diagnosis not present

## 2020-11-02 DIAGNOSIS — L304 Erythema intertrigo: Secondary | ICD-10-CM | POA: Diagnosis not present

## 2020-12-03 DIAGNOSIS — N486 Induration penis plastica: Secondary | ICD-10-CM | POA: Diagnosis not present

## 2020-12-03 DIAGNOSIS — N529 Male erectile dysfunction, unspecified: Secondary | ICD-10-CM | POA: Diagnosis not present

## 2020-12-03 DIAGNOSIS — L304 Erythema intertrigo: Secondary | ICD-10-CM | POA: Diagnosis not present

## 2020-12-03 DIAGNOSIS — E221 Hyperprolactinemia: Secondary | ICD-10-CM | POA: Diagnosis not present

## 2021-03-04 DIAGNOSIS — B351 Tinea unguium: Secondary | ICD-10-CM | POA: Diagnosis not present

## 2021-03-04 DIAGNOSIS — L578 Other skin changes due to chronic exposure to nonionizing radiation: Secondary | ICD-10-CM | POA: Diagnosis not present

## 2021-03-04 DIAGNOSIS — B356 Tinea cruris: Secondary | ICD-10-CM | POA: Diagnosis not present

## 2021-03-15 DIAGNOSIS — M9902 Segmental and somatic dysfunction of thoracic region: Secondary | ICD-10-CM | POA: Diagnosis not present

## 2021-03-15 DIAGNOSIS — M4012 Other secondary kyphosis, cervical region: Secondary | ICD-10-CM | POA: Diagnosis not present

## 2021-03-15 DIAGNOSIS — M9903 Segmental and somatic dysfunction of lumbar region: Secondary | ICD-10-CM | POA: Diagnosis not present

## 2021-03-15 DIAGNOSIS — M9901 Segmental and somatic dysfunction of cervical region: Secondary | ICD-10-CM | POA: Diagnosis not present

## 2021-03-15 DIAGNOSIS — S39012A Strain of muscle, fascia and tendon of lower back, initial encounter: Secondary | ICD-10-CM | POA: Diagnosis not present

## 2021-03-15 DIAGNOSIS — S29019A Strain of muscle and tendon of unspecified wall of thorax, initial encounter: Secondary | ICD-10-CM | POA: Diagnosis not present

## 2021-03-29 DIAGNOSIS — M9901 Segmental and somatic dysfunction of cervical region: Secondary | ICD-10-CM | POA: Diagnosis not present

## 2021-03-29 DIAGNOSIS — S39012A Strain of muscle, fascia and tendon of lower back, initial encounter: Secondary | ICD-10-CM | POA: Diagnosis not present

## 2021-03-29 DIAGNOSIS — M9903 Segmental and somatic dysfunction of lumbar region: Secondary | ICD-10-CM | POA: Diagnosis not present

## 2021-03-29 DIAGNOSIS — M9902 Segmental and somatic dysfunction of thoracic region: Secondary | ICD-10-CM | POA: Diagnosis not present

## 2021-03-29 DIAGNOSIS — M4012 Other secondary kyphosis, cervical region: Secondary | ICD-10-CM | POA: Diagnosis not present

## 2021-03-29 DIAGNOSIS — S29019A Strain of muscle and tendon of unspecified wall of thorax, initial encounter: Secondary | ICD-10-CM | POA: Diagnosis not present

## 2021-04-26 DIAGNOSIS — M9901 Segmental and somatic dysfunction of cervical region: Secondary | ICD-10-CM | POA: Diagnosis not present

## 2021-04-26 DIAGNOSIS — M4012 Other secondary kyphosis, cervical region: Secondary | ICD-10-CM | POA: Diagnosis not present

## 2021-04-26 DIAGNOSIS — M9903 Segmental and somatic dysfunction of lumbar region: Secondary | ICD-10-CM | POA: Diagnosis not present

## 2021-04-26 DIAGNOSIS — S29019A Strain of muscle and tendon of unspecified wall of thorax, initial encounter: Secondary | ICD-10-CM | POA: Diagnosis not present

## 2021-04-26 DIAGNOSIS — M9902 Segmental and somatic dysfunction of thoracic region: Secondary | ICD-10-CM | POA: Diagnosis not present

## 2021-04-26 DIAGNOSIS — S39012A Strain of muscle, fascia and tendon of lower back, initial encounter: Secondary | ICD-10-CM | POA: Diagnosis not present

## 2021-04-27 DIAGNOSIS — J069 Acute upper respiratory infection, unspecified: Secondary | ICD-10-CM | POA: Diagnosis not present

## 2021-04-27 DIAGNOSIS — R0981 Nasal congestion: Secondary | ICD-10-CM | POA: Diagnosis not present

## 2021-04-28 DIAGNOSIS — Z23 Encounter for immunization: Secondary | ICD-10-CM | POA: Diagnosis not present

## 2021-05-25 DIAGNOSIS — M9902 Segmental and somatic dysfunction of thoracic region: Secondary | ICD-10-CM | POA: Diagnosis not present

## 2021-05-25 DIAGNOSIS — S29019A Strain of muscle and tendon of unspecified wall of thorax, initial encounter: Secondary | ICD-10-CM | POA: Diagnosis not present

## 2021-05-25 DIAGNOSIS — M4012 Other secondary kyphosis, cervical region: Secondary | ICD-10-CM | POA: Diagnosis not present

## 2021-05-25 DIAGNOSIS — S39012A Strain of muscle, fascia and tendon of lower back, initial encounter: Secondary | ICD-10-CM | POA: Diagnosis not present

## 2021-05-25 DIAGNOSIS — M9903 Segmental and somatic dysfunction of lumbar region: Secondary | ICD-10-CM | POA: Diagnosis not present

## 2021-05-25 DIAGNOSIS — M9901 Segmental and somatic dysfunction of cervical region: Secondary | ICD-10-CM | POA: Diagnosis not present

## 2021-06-14 DIAGNOSIS — Z8711 Personal history of peptic ulcer disease: Secondary | ICD-10-CM | POA: Diagnosis not present

## 2021-06-14 DIAGNOSIS — Z0000 Encounter for general adult medical examination without abnormal findings: Secondary | ICD-10-CM | POA: Diagnosis not present

## 2021-06-14 DIAGNOSIS — N400 Enlarged prostate without lower urinary tract symptoms: Secondary | ICD-10-CM | POA: Diagnosis not present

## 2021-06-14 DIAGNOSIS — M159 Polyosteoarthritis, unspecified: Secondary | ICD-10-CM | POA: Diagnosis not present

## 2021-06-14 DIAGNOSIS — N529 Male erectile dysfunction, unspecified: Secondary | ICD-10-CM | POA: Diagnosis not present

## 2021-06-14 DIAGNOSIS — E782 Mixed hyperlipidemia: Secondary | ICD-10-CM | POA: Diagnosis not present

## 2021-06-21 DIAGNOSIS — H2513 Age-related nuclear cataract, bilateral: Secondary | ICD-10-CM | POA: Diagnosis not present

## 2021-06-27 DIAGNOSIS — E782 Mixed hyperlipidemia: Secondary | ICD-10-CM | POA: Diagnosis not present

## 2021-06-27 DIAGNOSIS — Z125 Encounter for screening for malignant neoplasm of prostate: Secondary | ICD-10-CM | POA: Diagnosis not present

## 2021-07-04 DIAGNOSIS — H2511 Age-related nuclear cataract, right eye: Secondary | ICD-10-CM | POA: Diagnosis not present

## 2021-07-04 DIAGNOSIS — H52201 Unspecified astigmatism, right eye: Secondary | ICD-10-CM | POA: Diagnosis not present

## 2021-07-18 DIAGNOSIS — H2512 Age-related nuclear cataract, left eye: Secondary | ICD-10-CM | POA: Diagnosis not present

## 2021-07-18 DIAGNOSIS — H52202 Unspecified astigmatism, left eye: Secondary | ICD-10-CM | POA: Diagnosis not present

## 2021-12-13 ENCOUNTER — Other Ambulatory Visit: Payer: Self-pay | Admitting: Orthopaedic Surgery

## 2021-12-13 DIAGNOSIS — Z01818 Encounter for other preprocedural examination: Secondary | ICD-10-CM

## 2021-12-27 ENCOUNTER — Ambulatory Visit
Admission: RE | Admit: 2021-12-27 | Discharge: 2021-12-27 | Disposition: A | Discharge status: Home / Self Care | Payer: PPO | Source: Ambulatory Visit | Attending: Orthopaedic Surgery | Admitting: Orthopaedic Surgery

## 2021-12-27 DIAGNOSIS — Z01818 Encounter for other preprocedural examination: Secondary | ICD-10-CM

## 2022-01-11 ENCOUNTER — Encounter (HOSPITAL_BASED_OUTPATIENT_CLINIC_OR_DEPARTMENT_OTHER): Payer: Self-pay | Admitting: Orthopaedic Surgery

## 2022-01-11 ENCOUNTER — Other Ambulatory Visit: Payer: Self-pay

## 2022-01-12 ENCOUNTER — Encounter (HOSPITAL_BASED_OUTPATIENT_CLINIC_OR_DEPARTMENT_OTHER)
Admission: RE | Admit: 2022-01-12 | Discharge: 2022-01-12 | Disposition: A | Discharge status: Home / Self Care | Payer: PPO | Source: Ambulatory Visit | Attending: Orthopaedic Surgery | Admitting: Orthopaedic Surgery

## 2022-01-12 DIAGNOSIS — Z01812 Encounter for preprocedural laboratory examination: Secondary | ICD-10-CM | POA: Insufficient documentation

## 2022-01-12 LAB — SURGICAL PCR SCREEN
MRSA, PCR: NEGATIVE
Staphylococcus aureus: NEGATIVE

## 2022-01-18 NOTE — Discharge Instructions (Addendum)
Ophelia Charter MD, MPH Noemi Chapel, PA-C [place] [address] [phone] (tel)   [phone] (fax)   [place] may leave the operative dressing in place until your follow-up appointment. KEEP THE INCISIONS CLEAN AND DRY. There may be a small amount of fluid/bleeding leaking at the surgical site. This is normal after surgery.  If it fills with liquid or blood please call us immediately to change it for you. Use the provided ice machine or Ice packs as often as possible for the first 3-4 days, then as needed for pain relief.   Keep a layer of cloth or a shirt between your skin and the cooling unit to prevent frost bite as it can get very cold.  SHOWERING: - You may shower on Post-Op Day #2.  - The dressing is water resistant but do not scrub it as it may start to peel up.   - You may remove the sling for showering - Gently pat the area dry.  - Do not soak the shoulder in water.  - Do not go swimming in the pool or ocean until your incision has completely healed (about 4-6 weeks after surgery) - KEEP THE INCISIONS CLEAN AND DRY.  EXERCISES Wear the sling at all times  You may remove the sling for showering, but keep the arm across the chest or in a secondary sling.    Accidental/Purposeful External Rotation and shoulder flexion (reaching behind you) is to be avoided at all costs for the first month. It is ok to come out of your sling if your are sitting and have assistance for eating.   Do not lift anything heavier than 1 pound until we discuss it further in clinic.  It is normal for your fingers/hand to become more swollen after surgery and discolored from bruising.   This will resolve over the first few weeks usually after surgery. Please continue to ambulate and do not stay sitting or lying for too long.  Perform foot and wrist pumps to assist in circulation.  PHYSICAL  THERAPY - You will begin physical therapy soon after surgery  - Please call to set up an appointment, if you do not already have one  - Let our office if there are any issues with scheduling your therapy  - A hard copy of your physical therapy prescription and physical therapy protocol was provided to you today - You should contact your physical therapy office of choice to set up an appointment if you have not already done so   REGIONAL ANESTHESIA (NERVE BLOCKS) The anesthesia team may have performed a nerve block for you this is a great tool used to minimize pain.   The block may start wearing off overnight (between 8-24 hours postop) When the block wears off, your pain may go from nearly zero to the pain you would have had postop without the block. This is an abrupt transition but nothing dangerous is happening.   This can be a challenging period but utilize your as needed pain medications to try and manage this period. We suggest you use the pain medication the first night prior to going to bed, to ease this transition.  You may take an extra dose of narcotic when this happens if needed   POST-OP MEDICATIONS- Multimodal approach to pain control In general your pain will be controlled with a combination of substances.  Prescriptions unless otherwise discussed are electronically sent  to your pharmacy.  This is a carefully made plan we use to minimize narcotic use.     Meloxicam - Anti-inflammatory medication taken on a scheduled basis Acetaminophen - Non-narcotic pain medicine taken on a scheduled basis  Oxycodone - This is a strong narcotic, to be used only on an "as needed" basis for SEVERE pain. Aspirin '81mg'$  - This medicine is used to minimize the risk of blood clots after surgery. Zofran -  take as needed for nausea   FOLLOW-UP If you develop a Fever (>101.5), Redness or Drainage from the surgical incision site, please call our office to arrange for an evaluation. Please call the  office to schedule a follow-up appointment for a wound check, 7-10 days post-operatively.  IF YOU HAVE ANY QUESTIONS, PLEASE FEEL FREE TO CALL OUR OFFICE.  HELPFUL INFORMATION  Your arm will be in a sling following surgery. You will be in this sling for the next 4 weeks.   You may be more comfortable sleeping in a semi-seated position the first few nights following surgery.  Keep a pillow propped under the elbow and forearm for comfort.  If you have a recliner type of chair it might be beneficial.  If not that is fine too, but it would be helpful to sleep propped up with pillows behind your operated shoulder as well under your elbow and forearm.  This will reduce pulling on the suture lines.  When dressing, put your operative arm in the sleeve first.  When getting undressed, take your operative arm out last.  Loose fitting, button-down shirts are recommended.  In most states it is against the law to drive while your arm is in a sling. And certainly against the law to drive while taking narcotics.  You may return to work/school in the next couple of days when you feel up to it. Desk work and typing in the sling is fine.  We suggest you use the pain medication the first night prior to going to bed, in order to ease any pain when the anesthesia wears off. You should avoid taking pain medications on an empty stomach as it will make you nauseous.  You should wean off your narcotic medicines as soon as you are able.     Most patients will be off or using minimal narcotics before their first postop appointment.   Do not drink alcoholic beverages or take illicit drugs when taking pain medications.  Pain medication may make you constipated.  Below are a few solutions to try in this order: Decrease the amount of pain medication if you aren't having pain. Drink lots of decaffeinated fluids. Drink prune juice and/or each dried prunes  If the first 3 don't work start with additional solutions Take  Colace - an over-the-counter stool softener Take Senokot - an over-the-counter laxative Take Miralax - a stronger over-the-counter laxative   Dental Antibiotics:  In most cases prophylactic antibiotics for Dental procdeures after total joint surgery are not necessary.  Exceptions are as follows:  1. History of prior total joint infection  2. Severely immunocompromised (Organ Transplant, cancer chemotherapy, Rheumatoid biologic meds such as [place])  3. Poorly controlled diabetes (A1C &gt; 8.0, blood glucose over 200)  If you have one of these conditions, contact your surgeon for an antibiotic prescription, prior to your dental procedure.   For more information including helpful videos and documents visit our website:   [email or web address]    No Tylenol before 1pm   Post Anesthesia Home Care Instructions  Activity: Get plenty of rest for the remainder of the day. A responsible individual must stay with you for 24 hours following the procedure.  For the next 24 hours, DO NOT: -Drive a car -Paediatric nurse -Drink alcoholic beverages -Take any medication unless instructed by your physician -Make any legal decisions or sign important papers.  Meals: Start with liquid foods such as gelatin or soup. Progress to regular foods as tolerated. Avoid greasy, spicy, heavy foods. If nausea and/or vomiting occur, drink only clear liquids until the nausea and/or vomiting subsides. Call your physician if vomiting continues.  Special Instructions/Symptoms: Your throat may feel dry or sore from the anesthesia or the breathing tube placed in your throat during surgery. If this causes discomfort, gargle with warm salt water. The discomfort should disappear within 24 hours.  If you had a scopolamine patch placed behind your ear for the management of post- operative nausea and/or vomiting:  1. The medication in the patch is effective for 72 hours, after  which it should be removed.  Wrap patch in a tissue and discard in the trash. Wash hands thoroughly with soap and water. 2. You may remove the patch earlier than 72 hours if you experience unpleasant side effects which may include dry mouth, dizziness or visual disturbances. 3. Avoid touching the patch. Wash your hands with soap and water after contact with the patch.    Regional Anesthesia Blocks  1. Numbness or the inability to move the "blocked" extremity may last from 3-48 hours after placement. The length of time depends on the medication injected and your individual response to the medication. If the numbness is not going away after 48 hours, call your surgeon.  2. The extremity that is blocked will need to be protected until the numbness is gone and the  Strength has returned. Because you cannot feel it, you will need to take extra care to avoid injury. Because it may be weak, you may have difficulty moving it or using it. You may not know what position it is in without looking at it while the block is in effect.  3. For blocks in the legs and feet, returning to weight bearing and walking needs to be done carefully. You will need to wait until the numbness is entirely gone and the strength has returned. You should be able to move your leg and foot normally before you try and bear weight or walk. You will need someone to be with you when you first try to ensure you do not fall and possibly risk injury.  4. Bruising and tenderness at the needle site are common side effects and will resolve in a few days.  5. Persistent numbness or new problems with movement should be communicated to the surgeon or the [facility] [phone]).

## 2022-01-18 NOTE — H&P (Signed)
PREOPERATIVE H&P  Chief Complaint: djd left shoulder  HPI: Philip Preston is a 71 y.o. male who is scheduled for Procedure(s): REVERSE SHOULDER ARTHROPLASTY.   Patient has a past medical history significant for GERD.   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  He has known he has a cuff tear for awhile.  He has a history of a right distal triceps repair that became infected afterwards.  He is frustrated by this.    Symptoms are rated as moderate to severe, and have been worsening.  This is significantly impairing activities of daily living.    Please see clinic note for further details on this patient's care.    He has elected for surgical management.   Past Medical History:  Diagnosis Date   BPH associated with nocturia    ED (erectile dysfunction)    GERD (gastroesophageal reflux disease)    OA (osteoarthritis)    knees,    Wears glasses    Past Surgical History:  Procedure Laterality Date   APPENDECTOMY  age 79   BICEPS TENDON REPAIR Left 2012   COLONOSCOPY  04/2017   JOINT REPLACEMENT     bil TKR   KNEE ARTHROSCOPY Left x2  last one [place]  2012   [place]  child   TOTAL KNEE ARTHROPLASTY Left 05/06/2018   Procedure: LEFT TOTAL KNEE ARTHROPLASTY;  Surgeon: Vickey Huger, MD;  Location: [facility];  Service: Orthopedics;  Laterality: Left;  Adductor Block   TOTAL KNEE ARTHROPLASTY Right 12/16/2018   Procedure: TOTAL KNEE ARTHROPLASTY;  Surgeon: Vickey Huger, MD;  Location: [facility];  Service: Orthopedics;  Laterality: Right;   TRICEPS TENDON REPAIR Right x2  last one 2012   first time repair of tendon then post infection surgery   UPPER GI ENDOSCOPY     Social History   Socioeconomic History   Marital status: Married    Spouse name: Not on file   Number of children: Not on file   Years of education: Not  on file   Highest education level: Not on file  Occupational History   Not on file  Tobacco Use   Smoking status: Never   Smokeless tobacco: Never  Vaping Use   Vaping Use: Never used  Substance and Sexual Activity   Alcohol use: Never   Drug use: Never   Sexual activity: Yes  Other Topics Concern   Not on file  Social History Narrative   Not on file   Social Determinants of Health   Financial Resource Strain: Not on file  Food Insecurity: Not on file  Transportation Needs: Not on file  Physical Activity: Not on file  Stress: Not on file  Social Connections: Not on file   History reviewed. No pertinent family history. Allergies  Allergen Reactions   Levofloxacin Rash    Tore tendons    Prior to Admission medications   Medication Sig Start Date End Date Taking? Authorizing Provider  acetaminophen (TYLENOL) 500 MG tablet Take 2 tablets (1,000 mg total) by mouth every 6 (six) hours. Patient taking differently: Take 1,000 mg by mouth every 6 (six) hours as needed for moderate pain. 05/07/18  Yes Donia Ast, [place]  Ascorbic Acid (VITAMIN C) 1000 MG tablet Take 1,000 mg by mouth daily.   Yes [provider]  [place] (GLUCOSAMINE  CHONDROITIN TRIPLE) TABS Take 1 tablet by mouth daily.   Yes [provider]  Multiple Vitamin (MULTIVITAMIN WITH MINERALS) TABS tablet Take 1 tablet by mouth daily.   Yes [provider]  Omega-3 Fatty Acids (FISH OIL) 1000 MG CAPS Take 1,000 mg by mouth daily.   Yes [provider]  pantoprazole (PROTONIX) 20 MG tablet Take 20 mg by mouth daily.   Yes [provider]  tadalafil (CIALIS) 10 MG tablet Take 10 mg by mouth daily as needed for erectile dysfunction.   Yes [provider]    ROS: All other systems have been reviewed and were otherwise negative with the exception of those mentioned in the HPI and as above.  Physical Exam: General: Alert, no acute  distress Cardiovascular: No pedal edema Respiratory: No cyanosis, no use of accessory musculature GI: No organomegaly, abdomen is soft and non-tender Skin: No lesions in the area of chief complaint Neurologic: Sensation intact distally Psychiatric: Patient is competent for consent with normal mood and affect Lymphatic: No axillary or cervical lymphadenopathy  MUSCULOSKELETAL:  Range of motion of the left shoulder is to 130, right is to 140.  External rotation of 10 versus 60.  Internal rotation to back pocket.  Cuff strength is weak throughout.    Imaging: X-rays demonstrate [place] 4B rotator cuff arthropathy on three views.  BMI: Estimated body mass index is 28.74 kg/m as calculated from the following:   Height as of this encounter: '5\' 8"'$  (1.727 m).   Weight as of this encounter: 85.7 kg.  Lab Results  Component Value Date   ALBUMIN 4.2 12/12/2018   Diabetes: Patient does not have a diagnosis of diabetes.     Smoking Status:   reports that he has never smoked. He has never used smokeless tobacco.     Assessment: djd left shoulder  Plan: Plan for Procedure(s): REVERSE SHOULDER ARTHROPLASTY   The risks benefits and alternatives were discussed with the patient including but not limited to the risks of nonoperative treatment, versus surgical intervention including infection, bleeding, nerve injury,  blood clots, cardiopulmonary complications, morbidity, mortality, among others, and they were willing to proceed.   We additionally specifically discussed risks of axillary nerve injury, infection, periprosthetic fracture, continued pain and longevity of implants prior to beginning procedure.   He understands he has an increased risk for infection, as well as damage to his structures, as well as dislocation considering his activity level.   Patient will be closely monitored in PACU for medical stabilization and pain control. If found stable in PACU, patient may be discharged  home with outpatient follow-up. If any concerns regarding patient's stabilization patient will be admitted for observation after surgery. The patient is planning to be discharged home with outpatient PT.   The patient acknowledged the explanation, agreed to proceed with the plan and consent was signed.   Operative Plan: Left reverse total shoulder arthroplasty Discharge Medications: Standard DVT Prophylaxis: Aspirin Physical Therapy: Outpatient PT Special Discharge needs: Sling (given sling in the office). [place], PA-C  01/18/2022 11:03 AM

## 2022-01-19 ENCOUNTER — Ambulatory Visit (HOSPITAL_BASED_OUTPATIENT_CLINIC_OR_DEPARTMENT_OTHER)
Admission: RE | Admit: 2022-01-19 | Discharge: 2022-01-19 | Disposition: A | Discharge status: Home / Self Care | Payer: PPO | Attending: Orthopaedic Surgery | Admitting: Orthopaedic Surgery

## 2022-01-19 ENCOUNTER — Ambulatory Visit (HOSPITAL_COMMUNITY): Payer: PPO

## 2022-01-19 ENCOUNTER — Other Ambulatory Visit: Payer: Self-pay

## 2022-01-19 ENCOUNTER — Encounter (HOSPITAL_BASED_OUTPATIENT_CLINIC_OR_DEPARTMENT_OTHER): Payer: Self-pay | Admitting: Orthopaedic Surgery

## 2022-01-19 ENCOUNTER — Ambulatory Visit (HOSPITAL_BASED_OUTPATIENT_CLINIC_OR_DEPARTMENT_OTHER): Payer: PPO | Admitting: Certified Registered"

## 2022-01-19 ENCOUNTER — Encounter (HOSPITAL_BASED_OUTPATIENT_CLINIC_OR_DEPARTMENT_OTHER)
Admission: RE | Disposition: A | Discharge status: Home / Self Care | Payer: Self-pay | Source: Home / Self Care | Attending: Orthopaedic Surgery

## 2022-01-19 DIAGNOSIS — K219 Gastro-esophageal reflux disease without esophagitis: Secondary | ICD-10-CM | POA: Insufficient documentation

## 2022-01-19 DIAGNOSIS — M12812 Other specific arthropathies, not elsewhere classified, left shoulder: Secondary | ICD-10-CM

## 2022-01-19 DIAGNOSIS — M19012 Primary osteoarthritis, left shoulder: Secondary | ICD-10-CM | POA: Insufficient documentation

## 2022-01-19 DIAGNOSIS — Z01818 Encounter for other preprocedural examination: Secondary | ICD-10-CM

## 2022-01-19 HISTORY — PX: REVERSE SHOULDER ARTHROPLASTY: SHX5054

## 2022-01-19 HISTORY — DX: Gastro-esophageal reflux disease without esophagitis: K21.9

## 2022-01-19 SURGERY — ARTHROPLASTY, SHOULDER, TOTAL, REVERSE
Anesthesia: General | Site: Shoulder | Laterality: Left

## 2022-01-19 MED ORDER — SUGAMMADEX SODIUM 500 MG/5ML IV SOLN
INTRAVENOUS | Status: AC
  Filled 2022-01-19: qty 10

## 2022-01-19 MED ORDER — EPHEDRINE 5 MG/ML INJ
INTRAVENOUS | Status: AC
  Filled 2022-01-19: qty 5

## 2022-01-19 MED ORDER — ONDANSETRON HCL 4 MG PO TABS: 4.0000 mg | ORAL_TABLET | Freq: Three times a day (TID) | ORAL | 0 refills | Status: AC | PRN

## 2022-01-19 MED ORDER — FENTANYL CITRATE (PF) 100 MCG/2ML IJ SOLN
INTRAMUSCULAR | Status: DC | PRN
Start: 2022-01-19 — End: 2022-01-19
  Administered 2022-01-19: 100 ug via INTRAVENOUS

## 2022-01-19 MED ORDER — BUPIVACAINE HCL (PF) 0.5 % IJ SOLN
INTRAMUSCULAR | Status: DC | PRN
  Administered 2022-01-19: 15 mL via PERINEURAL

## 2022-01-19 MED ORDER — MIDAZOLAM HCL 2 MG/2ML IJ SOLN
1.0000 mg | Freq: Once | INTRAMUSCULAR | Status: AC
  Administered 2022-01-19: 1 mg via INTRAVENOUS

## 2022-01-19 MED ORDER — TRANEXAMIC ACID-NACL 1000-0.7 MG/100ML-% IV SOLN
1000.0000 mg | INTRAVENOUS | Status: AC
  Administered 2022-01-19: 1000 mg via INTRAVENOUS

## 2022-01-19 MED ORDER — PROPOFOL 10 MG/ML IV BOLUS
INTRAVENOUS | Status: AC
  Filled 2022-01-19: qty 20

## 2022-01-19 MED ORDER — PROPOFOL 10 MG/ML IV BOLUS
INTRAVENOUS | Status: DC | PRN
  Administered 2022-01-19: 140 mg via INTRAVENOUS

## 2022-01-19 MED ORDER — 0.9 % SODIUM CHLORIDE (POUR BTL) OPTIME
TOPICAL | Status: DC | PRN
  Administered 2022-01-19: 1000 mL

## 2022-01-19 MED ORDER — ROCURONIUM BROMIDE 100 MG/10ML IV SOLN
INTRAVENOUS | Status: DC | PRN
  Administered 2022-01-19: 70 mg via INTRAVENOUS
  Administered 2022-01-19: 30 mg via INTRAVENOUS

## 2022-01-19 MED ORDER — BUPIVACAINE LIPOSOME 1.3 % IJ SUSP
INTRAMUSCULAR | Status: DC | PRN
  Administered 2022-01-19: 10 mL via PERINEURAL

## 2022-01-19 MED ORDER — ONDANSETRON HCL 4 MG/2ML IJ SOLN
INTRAMUSCULAR | Status: DC | PRN
  Administered 2022-01-19: 4 mg via INTRAVENOUS

## 2022-01-19 MED ORDER — TRANEXAMIC ACID-NACL 1000-0.7 MG/100ML-% IV SOLN
INTRAVENOUS | Status: AC
  Filled 2022-01-19: qty 100

## 2022-01-19 MED ORDER — GLYCOPYRROLATE PF 0.2 MG/ML IJ SOSY
PREFILLED_SYRINGE | INTRAMUSCULAR | Status: AC
  Filled 2022-01-19: qty 1

## 2022-01-19 MED ORDER — ACETAMINOPHEN 500 MG PO TABS
ORAL_TABLET | ORAL | Status: AC
  Filled 2022-01-19: qty 2

## 2022-01-19 MED ORDER — PHENYLEPHRINE HCL (PRESSORS) 10 MG/ML IV SOLN
INTRAVENOUS | Status: AC
  Filled 2022-01-19: qty 1

## 2022-01-19 MED ORDER — GLYCOPYRROLATE 0.2 MG/ML IJ SOLN
INTRAMUSCULAR | Status: DC | PRN
  Administered 2022-01-19 (×2): .1 mg via INTRAVENOUS

## 2022-01-19 MED ORDER — ONDANSETRON HCL 4 MG/2ML IJ SOLN
4.0000 mg | Freq: Once | INTRAMUSCULAR | Status: DC | PRN
Start: 2022-01-19 — End: 2022-01-19

## 2022-01-19 MED ORDER — FENTANYL CITRATE (PF) 100 MCG/2ML IJ SOLN
INTRAMUSCULAR | Status: AC
  Filled 2022-01-19: qty 2

## 2022-01-19 MED ORDER — HYDROMORPHONE HCL 1 MG/ML IJ SOLN: 0.2500 mg | INTRAMUSCULAR | Status: DC | PRN

## 2022-01-19 MED ORDER — PHENYLEPHRINE HCL (PRESSORS) 10 MG/ML IV SOLN
INTRAVENOUS | Status: DC | PRN
  Administered 2022-01-19: 80 ug via INTRAVENOUS

## 2022-01-19 MED ORDER — LIDOCAINE 2% (20 MG/ML) 5 ML SYRINGE
INTRAMUSCULAR | Status: DC | PRN
  Administered 2022-01-19: 60 mg via INTRAVENOUS

## 2022-01-19 MED ORDER — SUGAMMADEX SODIUM 500 MG/5ML IV SOLN
INTRAVENOUS | Status: DC | PRN
  Administered 2022-01-19: 350 mg via INTRAVENOUS

## 2022-01-19 MED ORDER — DEXAMETHASONE SODIUM PHOSPHATE 10 MG/ML IJ SOLN
INTRAMUSCULAR | Status: DC | PRN
  Administered 2022-01-19: 8 mg via INTRAVENOUS

## 2022-01-19 MED ORDER — CEFAZOLIN SODIUM-DEXTROSE 2-4 GM/100ML-% IV SOLN
INTRAVENOUS | Status: AC
  Filled 2022-01-19: qty 100

## 2022-01-19 MED ORDER — VANCOMYCIN HCL 1000 MG IV SOLR
INTRAVENOUS | Status: DC | PRN
  Administered 2022-01-19: 1000 mg

## 2022-01-19 MED ORDER — MIDAZOLAM HCL 2 MG/2ML IJ SOLN
INTRAMUSCULAR | Status: AC
  Filled 2022-01-19: qty 2

## 2022-01-19 MED ORDER — GABAPENTIN 300 MG PO CAPS
300.0000 mg | ORAL_CAPSULE | Freq: Once | ORAL | Status: AC
  Administered 2022-01-19: 300 mg via ORAL

## 2022-01-19 MED ORDER — ROCURONIUM BROMIDE 10 MG/ML (PF) SYRINGE
PREFILLED_SYRINGE | INTRAVENOUS | Status: AC
  Filled 2022-01-19: qty 10

## 2022-01-19 MED ORDER — EPHEDRINE SULFATE (PRESSORS) 50 MG/ML IJ SOLN
INTRAMUSCULAR | Status: DC | PRN
  Administered 2022-01-19: 10 mg via INTRAVENOUS
  Administered 2022-01-19: 15 mg via INTRAVENOUS

## 2022-01-19 MED ORDER — LIDOCAINE 2% (20 MG/ML) 5 ML SYRINGE
INTRAMUSCULAR | Status: AC
  Filled 2022-01-19: qty 5

## 2022-01-19 MED ORDER — MELOXICAM 15 MG PO TABS: 15.0000 mg | ORAL_TABLET | Freq: Every day | ORAL | 0 refills | Status: DC

## 2022-01-19 MED ORDER — ASPIRIN 81 MG PO CHEW: 81.0000 mg | CHEWABLE_TABLET | Freq: Two times a day (BID) | ORAL | 0 refills | Status: AC

## 2022-01-19 MED ORDER — LACTATED RINGERS IV SOLN: INTRAVENOUS | Status: DC

## 2022-01-19 MED ORDER — GABAPENTIN 300 MG PO CAPS
ORAL_CAPSULE | ORAL | Status: AC
  Filled 2022-01-19: qty 1

## 2022-01-19 MED ORDER — ACETAMINOPHEN 500 MG PO TABS
1000.0000 mg | ORAL_TABLET | Freq: Once | ORAL | Status: AC
  Administered 2022-01-19: 1000 mg via ORAL

## 2022-01-19 MED ORDER — PHENYLEPHRINE 80 MCG/ML (10ML) SYRINGE FOR IV PUSH (FOR BLOOD PRESSURE SUPPORT)
PREFILLED_SYRINGE | INTRAVENOUS | Status: AC
  Filled 2022-01-19: qty 10

## 2022-01-19 MED ORDER — ACETAMINOPHEN 500 MG PO TABS: 1000.0000 mg | ORAL_TABLET | Freq: Three times a day (TID) | ORAL | 0 refills | Status: AC

## 2022-01-19 MED ORDER — FENTANYL CITRATE (PF) 100 MCG/2ML IJ SOLN
50.0000 ug | Freq: Once | INTRAMUSCULAR | Status: AC
  Administered 2022-01-19: 50 ug via INTRAVENOUS

## 2022-01-19 MED ORDER — PHENYLEPHRINE HCL-NACL 20-0.9 MG/250ML-% IV SOLN
INTRAVENOUS | Status: DC | PRN
  Administered 2022-01-19: 25 ug/min via INTRAVENOUS

## 2022-01-19 MED ORDER — CEFAZOLIN SODIUM-DEXTROSE 2-4 GM/100ML-% IV SOLN
2.0000 g | INTRAVENOUS | Status: AC
  Administered 2022-01-19: 2 g via INTRAVENOUS

## 2022-01-19 MED ORDER — OXYCODONE HCL 5 MG PO TABS: ORAL_TABLET | ORAL | 0 refills | Status: AC

## 2022-01-19 SURGICAL SUPPLY — 73 items
AID PSTN UNV HD RSTRNT DISP (MISCELLANEOUS) ×1
APL PRP STRL LF DISP 70% ISPRP (MISCELLANEOUS) ×1
BASEPLATE GLENOID STD REV 42 (Joint) ×1 IMPLANT
BIT DRILL 3.2 PERIPHERAL SCREW (BIT) ×1 IMPLANT
BLADE HEX COATED 2.75 (ELECTRODE) IMPLANT
BLADE SAW SGTL 73X25 THK (BLADE) ×2 IMPLANT
BLADE SURG 10 STRL SS (BLADE) IMPLANT
BLADE SURG 15 STRL LF DISP TIS (BLADE) IMPLANT
BLADE SURG 15 STRL SS (BLADE)
BNDG COHESIVE 4X5 TAN ST LF (GAUZE/BANDAGES/DRESSINGS) IMPLANT
BRUSH SCRUB EZ PLAIN DRY (MISCELLANEOUS) ×2 IMPLANT
BSPLAT GLND +3 29 (Joint) ×1 IMPLANT
CHLORAPREP W/TINT 26 (MISCELLANEOUS) ×2 IMPLANT
CLSR STERI-STRIP ANTIMIC 1/2X4 (GAUZE/BANDAGES/DRESSINGS) ×2 IMPLANT
COOLER ICEMAN CLASSIC (MISCELLANEOUS) ×2 IMPLANT
COVER BACK TABLE 60X90IN (DRAPES) ×2 IMPLANT
COVER MAYO STAND STRL (DRAPES) ×2 IMPLANT
DRAPE IMP U-DRAPE 54X76 (DRAPES) ×2 IMPLANT
DRAPE INCISE IOBAN 66X45 STRL (DRAPES) ×2 IMPLANT
DRAPE U-SHAPE 76X120 STRL (DRAPES) ×4 IMPLANT
DRSG AQUACEL AG ADV 3.5X 6 (GAUZE/BANDAGES/DRESSINGS) ×2 IMPLANT
DRSG AQUACEL AG ADV 3.5X10 (GAUZE/BANDAGES/DRESSINGS) ×1 IMPLANT
ELECT BLADE 4.0 EZ CLEAN MEGAD (MISCELLANEOUS) ×2
ELECT REM PT RETURN 9FT ADLT (ELECTROSURGICAL) ×2
ELECTRODE BLDE 4.0 EZ CLN MEGD (MISCELLANEOUS) ×1 IMPLANT
ELECTRODE REM PT RTRN 9FT ADLT (ELECTROSURGICAL) ×1 IMPLANT
FACESHIELD WRAPAROUND (MASK) ×4 IMPLANT
FACESHIELD WRAPAROUND OR TEAM (MASK) ×2 IMPLANT
GLENOID BASEPLATE 29 +3 (Joint) ×1 IMPLANT
GLOVE BIO SURGEON STRL SZ 6.5 (GLOVE) ×4 IMPLANT
GLOVE BIOGEL PI IND STRL 6.5 (GLOVE) ×1 IMPLANT
GLOVE BIOGEL PI IND STRL 8 (GLOVE) ×1 IMPLANT
GLOVE BIOGEL PI INDICATOR 6.5 (GLOVE) ×1
GLOVE BIOGEL PI INDICATOR 8 (GLOVE) ×1
GLOVE ECLIPSE 7.5 STRL STRAW (GLOVE) ×3 IMPLANT
GLOVE ECLIPSE 8.0 STRL XLNG CF (GLOVE) ×4 IMPLANT
GOWN STRL REUS W/ TWL LRG LVL3 (GOWN DISPOSABLE) ×2 IMPLANT
GOWN STRL REUS W/TWL LRG LVL3 (GOWN DISPOSABLE) ×4
GOWN STRL REUS W/TWL XL LVL3 (GOWN DISPOSABLE) ×2 IMPLANT
GUIDE PIN 3X75 SHOULDER (PIN) ×2
GUIDEWIRE GLENOID 2.5X220 (WIRE) ×1 IMPLANT
HANDPIECE INTERPULSE COAX TIP (DISPOSABLE) ×2
HUMERAL SYSTEM SZ 3/4 42X+0 (Orthopedic Implant) ×2 IMPLANT
KIT SHOULDER STAB MARCO (KITS) ×2 IMPLANT
MANIFOLD NEPTUNE II (INSTRUMENTS) ×2 IMPLANT
PACK BASIN DAY SURGERY FS (CUSTOM PROCEDURE TRAY) ×2 IMPLANT
PACK SHOULDER (CUSTOM PROCEDURE TRAY) ×2 IMPLANT
PAD COLD SHLDR WRAP-ON (PAD) ×2 IMPLANT
PAD ORTHO SHOULDER 7X19 LRG (SOFTGOODS) ×2 IMPLANT
PENCIL SMOKE EVACUATOR (MISCELLANEOUS) IMPLANT
PIN GUIDE 3X75 SHOULDER (PIN) IMPLANT
RESTRAINT HEAD UNIVERSAL NS (MISCELLANEOUS) ×2 IMPLANT
SCREW 5.5X22 (Screw) ×1 IMPLANT
SCREW CENTRAL THREAD 6.5X45 (Screw) ×1 IMPLANT
SCREW PERIPHERAL 42 (Screw) ×1 IMPLANT
SET HNDPC FAN SPRY TIP SCT (DISPOSABLE) ×1 IMPLANT
SHEET MEDIUM DRAPE 40X70 STRL (DRAPES) ×2 IMPLANT
SLEEVE SCD COMPRESS KNEE MED (STOCKING) ×2 IMPLANT
SPIKE FLUID TRANSFER (MISCELLANEOUS) IMPLANT
SPONGE T-LAP 18X18 ~~LOC~~+RFID (SPONGE) ×2 IMPLANT
STEM HUMERAL STD SHORT SZ3 (Joint) ×1 IMPLANT
SUT ETHIBOND 2 V 37 (SUTURE) ×2 IMPLANT
SUT ETHIBOND NAB CT1 #1 30IN (SUTURE) ×2 IMPLANT
SUT FIBERWIRE #5 38 CONV NDL (SUTURE) ×10
SUT MNCRL AB 4-0 PS2 18 (SUTURE) ×2 IMPLANT
SUT VIC AB 0 CT1 27 (SUTURE)
SUT VIC AB 0 CT1 27XBRD ANBCTR (SUTURE) IMPLANT
SUT VIC AB 3-0 SH 27 (SUTURE) ×2
SUT VIC AB 3-0 SH 27X BRD (SUTURE) ×1 IMPLANT
SUTURE FIBERWR #5 38 CONV NDL (SUTURE) ×4 IMPLANT
SYSTEM HUMERALTEM SZ 3/4 42X+0 (Orthopedic Implant) IMPLANT
TOWEL GREEN STERILE FF (TOWEL DISPOSABLE) ×6 IMPLANT
TUBE SUCTION HIGH CAP CLEAR NV (SUCTIONS) ×2 IMPLANT

## 2022-01-19 NOTE — Progress Notes (Signed)
Assisted Dr. Foster with left, interscalene , ultrasound guided block. Side rails up, monitors on throughout procedure. See vital signs in flow sheet. Tolerated Procedure well. 

## 2022-01-19 NOTE — Anesthesia Postprocedure Evaluation (Signed)
Anesthesia Post Note  Patient: Philip Preston  Procedure(s) Performed: REVERSE SHOULDER ARTHROPLASTY (Left: Shoulder)     Patient location during evaluation: PACU Anesthesia Type: General Level of consciousness: awake and alert and oriented Pain management: pain level controlled Vital Signs Assessment: post-procedure vital signs reviewed and stable Respiratory status: spontaneous breathing, nonlabored ventilation and respiratory function stable Cardiovascular status: blood pressure returned to baseline and stable Postop Assessment: no apparent nausea or vomiting Anesthetic complications: no   No notable events documented.  Last Vitals:  Vitals:   01/19/22 1015 01/19/22 1030  BP: 124/68 119/69  Pulse: 72 65  Resp: 10 (!) 9  Temp:    SpO2: 95% 94%    Last Pain:  Vitals:   01/19/22 1015  TempSrc:   PainSc: 0-No pain                 Philip Preston,Philip A.

## 2022-01-19 NOTE — Anesthesia Procedure Notes (Signed)
Anesthesia Regional Block: Interscalene brachial plexus block   Pre-Anesthetic Checklist: , timeout performed,  Correct Patient, Correct Site, Correct Laterality,  Correct Procedure, Correct Position, site marked,  Risks and benefits discussed,  Surgical consent,  Pre-op evaluation,  At surgeon's request and post-op pain management  Laterality: Left  Prep: chloraprep       Needles:  Injection technique: Single-shot  Needle Type: Echogenic Stimulator Needle     Needle Length: 10cm  Needle Gauge: 21   Needle insertion depth: 6 cm   Additional Needles:     Motor weakness within 10 minutes.  Narrative:  Start time: 01/19/2022 7:55 AM End time: 01/19/2022 8:00 AM Injection made incrementally with aspirations every 5 mL.  Performed by: Personally  Anesthesiologist: Josephine Igo, MD    Left Interscalene Block

## 2022-01-19 NOTE — Interval H&P Note (Signed)
All questions answered

## 2022-01-19 NOTE — Transfer of Care (Signed)
Immediate Anesthesia Transfer of Care Note  Patient: Philip Preston  Procedure(s) Performed: REVERSE SHOULDER ARTHROPLASTY (Left: Shoulder)  Patient Location: PACU  Anesthesia Type:GA combined with regional for post-op pain  Level of Consciousness: drowsy  Airway & Oxygen Therapy: Patient Spontanous Breathing and Patient connected to face mask oxygen  Post-op Assessment: Report given to RN and Post -op Vital signs reviewed and stable  Post vital signs: Reviewed and stable  Last Vitals:  Vitals Value Taken Time  BP 140/66 01/19/22 0948  Temp    Pulse 61 01/19/22 0949  Resp 13 01/19/22 0949  SpO2 98 % 01/19/22 0949  Vitals shown include unvalidated device data.  Last Pain:  Vitals:   01/19/22 0644  TempSrc: Oral  PainSc: 0-No pain         Complications: No notable events documented.

## 2022-01-19 NOTE — Op Note (Signed)
Orthopaedic Surgery Operative Note (CSN: [id number])  Philip Preston  Mar 20, 1951 Date of Surgery: 01/19/2022   Diagnoses:  Left shoulder rotator cuff arthropathy  Procedure: Left lateralized reverse total Shoulder Arthroplasty   Operative Finding Successful completion of planned procedure.  Patient had a complete tear of his supraspinatus and infraspinatus with upper border subscapularis tearing and a chronic biceps rupture.  He had early wear consistent with cuff tear arthropathy.  Tug test was normal.  Robust bony fixation and good overall construct stability.  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  Post-operative plan: The patient will be NWB in sling.  The patient will be will be discharged from PACU if continues to be stable as was plan prior to surgery.  DVT prophylaxis Aspirin 81 mg twice daily for 6 weeks.  Pain control with PRN pain medication preferring oral medicines.  Follow up plan will be scheduled in approximately 7 days for incision check and XR.  Physical therapy to start immediately.  Implants: Tornier perform humeral size 3 stem, 0 polyethylene, 42 glenosphere with a 29+3 baseplate and a 45 center screw with 2 peripheral locking screws.  Post-Op Diagnosis: Same Surgeons:Primary: Hiram Gash, MD Assistants:Caroline McBane PA-C Location: [place] OR ROOM 6 Anesthesia: General with Exparel Interscalene Antibiotics: Ancef 2g preop, Vancomycin '1000mg'$  locally Tourniquet time: None Estimated Blood Loss: 846 Complications: None Specimens: None Implants: Implant Name Type Inv. Item Serial No. Manufacturer Lot No. LRB No. Used Action  GLENOID BASEPLATE 29 +3 - [id number] Joint GLENOID BASEPLATE 29 +3 [id number] [employer]  Left 1 Implanted  SCREW CENTRAL THREAD 6.5X45 - [id number] Screw SCREW CENTRAL THREAD 6.5X45   [employer] STERILIZED IN TRAY Left 1 Implanted  BASEPLATE GLENOID STD REV 42 - [id number] Joint BASEPLATE GLENOID STD REV 42 [id number] [employer]  Left 1 Implanted  STEM HUMERAL STD SHORT SZ3 - [id number] Joint STEM HUMERAL STD SHORT SZ3 [id number] [place]  Left 1 Implanted  HUMERAL SYSTEM SZ 3/4 42X+0 - [id number] Orthopedic Implant HUMERAL SYSTEM SZ 3/4 42X+0 [id number] [employer]  Left 1 Implanted  SCREW 5.5X22 - [id number] Screw SCREW 5.5X22  [employer] STERILIZED IN TRAY Left 1 Implanted  SCREW PERIPHERAL 42 - [id number] Screw SCREW PERIPHERAL [place] STERILIZED IN TRAY Left 1 Implanted    Indications for Surgery:   Philip Preston is a 71 y.o. male with cuff tear arthropathy and previous history of contralateral infected upper extremity surgery.  Benefits and risks of operative and nonoperative management were discussed prior to surgery with patient/guardian(s) and informed consent form was completed.  Infection and need for further surgery were discussed as was prosthetic stability and cuff issues.  We additionally specifically discussed risks of axillary nerve injury, infection, periprosthetic fracture, continued pain and longevity of implants prior to beginning procedure.      Procedure:   The patient was identified in the preoperative holding area where the surgical site was marked. Block placed by anesthesia with exparel.  The patient was taken to the OR where a procedural timeout was called and the above noted anesthesia was induced.  The patient was positioned beachchair on allen table with spider arm positioner.  Preoperative antibiotics were dosed.  The patient's left shoulder was prepped and draped in the usual sterile fashion.  A second preoperative timeout was called.  Standard deltopectoral approach was performed with a #10 blade. We dissected down to the subcutaneous tissues and the cephalic vein was taken laterally with the deltoid. Clavipectoral fascia  was incised in line with the incision. Deep retractors were placed. The long of the biceps tendon was identified and there was significant tenosynovitis present.  Tenodesis was performed to the pectoralis tendon with #2 Ethibond. The remaining biceps was followed up into the rotator interval where it was released.   The subscapularis was taken down in a full thickness layer with capsule along the humeral neck extending inferiorly around the humeral head. We continued releasing the capsule directly off of the osteophytes inferiorly all the way around the corner. This allowed [place] to dislocate the humeral head.   The rotator cuff was carefully examined and noted to be irreperably torn.  The decision was confirmed that a reverse total shoulder was indicated for this patient.  There were osteophytes along the inferior humeral neck. The osteophytes were removed with an osteotome and a rongeur.  Osteophytes were removed with a rongeur and an osteotome and the anatomic neck was well visualized.     A humeral cutting guide was used extra medullary with a pin to help control version. The version was set at 20 of retroversion. Humeral osteotomy was performed with an oscillating saw. The head fragment was passed off the back table.  A cut protector plate was placed.  The subscapularis was again identified and immediately we took care to palpate the axillary nerve anteriorly and verify its position with gentle palpation as well as the tug test.  We then released the SGHL with bovie cautery prior to placing a curved mayo at the junction of the anterior glenoid well above the axillary nerve and bluntly dissecting the subscapularis from the capsule.  We then carefully protected the axillary nerve as we gently released the inferior capsule to fully mobilize the subscapularis.  An anterior deltoid retractor was then placed as well as a small Hohmann retractor superiorly.  The glenoid was relatively intact in the setting  of an irreparable cuff tear  The remaining labrum was removed circumferentially taking great care not to disrupt the posterior capsule.   The glenoid drill guide was placed and used to drill a guide pin in the center, inferior position. The glenoid face was then reamed concentrically over the guide wire. The center hole was drilled over the guidepin in a near anatomic angle of version. Next the glenoid vault was drilled back to a depth of 45 mm.  We tapped and then placed a 79m size baseplate with additional 372mlateralization was selected with a 6.5 mm x 45 mm length central screw.  The base plate was screwed into the glenoid vault obtaining secure fixation. We next placed superior and inferior locking screws for additional fixation.  Next a 42 mm glenosphere was selected and impacted onto the baseplate. The center screw was tightened.  We turned attention back to the humeral side. The cut protector was removed.  We used the perform humeral sizing block to select the appropriate size which for this patient was a 3.  We then placed our center pin and reamed over it concentrically obtaining appropriate inset.  We then used our lateralizing chisel to prepare the lateral aspect of the humerus.  At that point we selected the appropriate implant trialing a 3.  Using this trial implant we trialed multiple polyethylene sizes settling on a 0 which provided good stability and range of  motion without excess soft tissue tension. The offset was dialed in to match the normal anatomy. The shoulder was trialed.  There was good ROM in all planes and the shoulder was stable with no inferior translation.  The real humeral implants were opened after again confirming sizes.  The trial was removed. #5 Fiberwire x4 sutures passed through the humeral neck for subscap repair. The humeral component was press-fit obtaining a secure fit. The joint was reduced and thoroughly irrigated with pulsatile lavage. Subscap was repaired back  with #5 Fiberwire sutures through bone tunnels. Hemostasis was obtained. The deltopectoral interval was reapproximated with #1 Ethibond. The subcutaneous tissues were closed with 2-0 Vicryl and the skin was closed with running monocryl.    The wounds were cleaned and dried and an Aquacel dressing was placed. The drapes taken down. The arm was placed into sling with abduction pillow. Patient was awakened, extubated, and transferred to the recovery room in stable condition. There were no intraoperative complications. The sponge, needle, and attention counts were  correct at the end of the case.     Noemi Chapel, PA-C, present and scrubbed throughout the case, critical for completion in a timely fashion, and for retraction, instrumentation, closure.

## 2022-01-19 NOTE — Anesthesia Procedure Notes (Signed)
Procedure Name: Intubation Date/Time: 01/19/2022 8:23 AM  Performed by: Lavonia Dana, CRNAPre-anesthesia Checklist: Patient identified, Emergency Drugs available, Suction available and Patient being monitored Patient Re-evaluated:Patient Re-evaluated prior to induction Oxygen Delivery Method: Circle system utilized Preoxygenation: Pre-oxygenation with 100% oxygen Induction Type: IV induction Ventilation: Mask ventilation without difficulty and Oral airway inserted - appropriate to patient size Laryngoscope Size: Mac and 4 Tube type: Oral Tube size: 7.5 mm Number of attempts: 1 Airway Equipment and Method: Stylet, Oral airway and Bite block Placement Confirmation: ETT inserted through vocal cords under direct vision, positive ETCO2 and breath sounds checked- equal and bilateral Secured at: 23 cm Tube secured with: Tape Dental Injury: Teeth and Oropharynx as per pre-operative assessment

## 2022-01-19 NOTE — Anesthesia Preprocedure Evaluation (Signed)
Anesthesia Evaluation  Patient identified by MRN, date of birth, ID band Patient awake    Reviewed: Allergy & Precautions, NPO status , Patient's Chart, lab work & pertinent test results  Airway Mallampati: II  TM Distance: >3 FB Neck ROM: Full    Dental no notable dental hx. (+) Caps, Teeth Intact, Dental Advisory Given   Pulmonary neg pulmonary ROS,    Pulmonary exam normal breath sounds clear to auscultation       Cardiovascular negative cardio ROS Normal cardiovascular exam Rhythm:Regular Rate:Normal     Neuro/Psych negative neurological ROS     GI/Hepatic Neg liver ROS, GERD  Medicated,  Endo/Other  negative endocrine ROS  Renal/GU negative Renal ROS  negative genitourinary   Musculoskeletal  (+) Arthritis , Osteoarthritis,    Abdominal   Peds  Hematology negative hematology ROS (+)   Anesthesia Other Findings   Reproductive/Obstetrics                             Anesthesia Physical Anesthesia Plan  ASA: 2  Anesthesia Plan: General   Post-op Pain Management: Regional block*, Minimal or no pain anticipated, Precedex and Dilaudid IV   Induction:   PONV Risk Score and Plan: 3 and Treatment may vary due to age or medical condition, Ondansetron, Dexamethasone and Midazolam  Airway Management Planned: Oral ETT  Additional Equipment: None  Intra-op Plan:   Post-operative Plan: Extubation in OR  Informed Consent: I have reviewed the patients History and Physical, chart, labs and discussed the procedure including the risks, benefits and alternatives for the proposed anesthesia with the patient or authorized representative who has indicated his/her understanding and acceptance.     Dental advisory given  Plan Discussed with: CRNA and Anesthesiologist  Anesthesia Plan Comments:         Anesthesia Quick Evaluation

## 2022-01-20 ENCOUNTER — Encounter (HOSPITAL_BASED_OUTPATIENT_CLINIC_OR_DEPARTMENT_OTHER): Payer: Self-pay | Admitting: Orthopaedic Surgery

## 2023-06-27 IMAGING — CT CT SHOULDER*L* W/O CM
2 of 3 series · 14 of 29 positions shown, 18 images · non-contrast
Comparison: None Available.

CLINICAL DATA: Chronic left shoulder pain.  No prior surgery.

EXAM:
CT OF THE UPPER LEFT EXTREMITY WITHOUT CONTRAST
TECHNIQUE: Multidetector CT imaging of the upper left extremity was performed
according to the standard protocol.
RADIATION DOSE REDUCTION: This exam was performed according to the
departmental dose-optimization program which includes automated
exposure control, adjustment of the mA and/or kV according to
patient size and/or use of iterative reconstruction technique.

[Series 10: shoulder 2.00 br40 s3 sag soft · sagittal · 0.40mm/px · 5 of 133 slices shown, 6 images]
[im 45/133  bone]
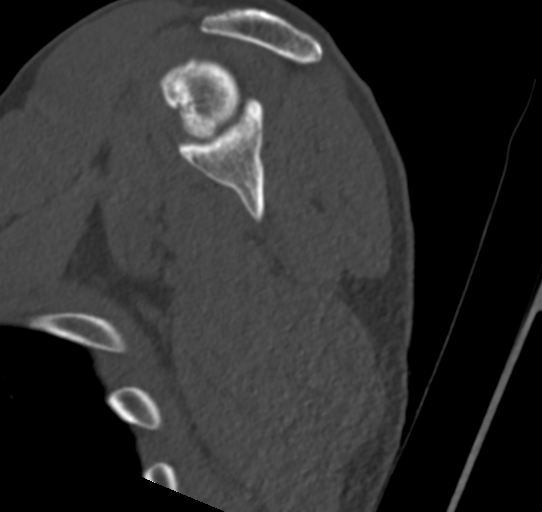
[im 56/133  bone]
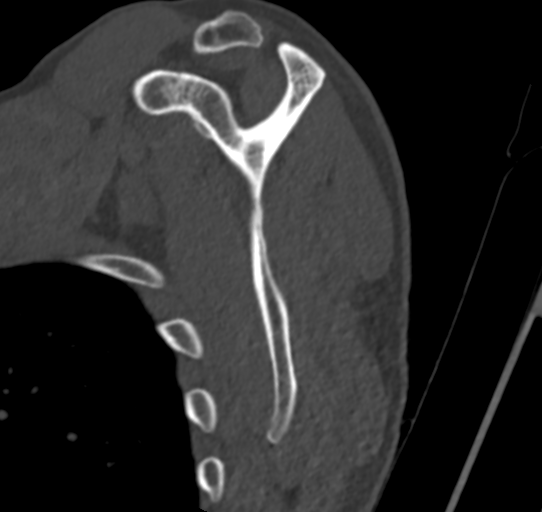
[im 67/133  soft-tissue]
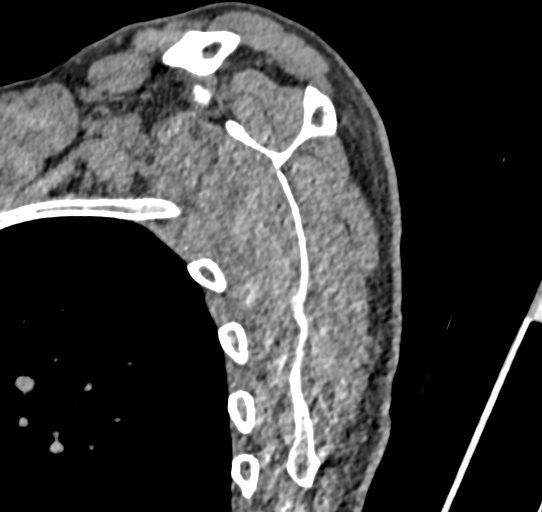
[im 67/133  bone]
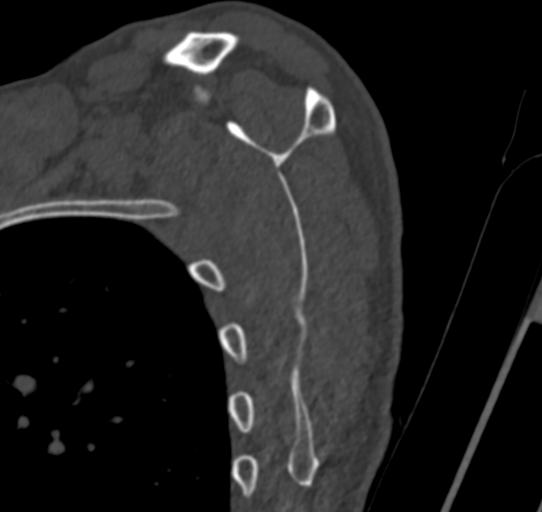
[im 78/133  bone]
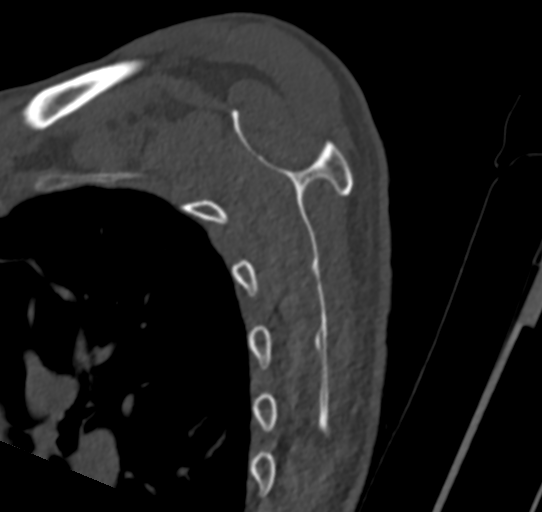
[im 89/133  bone]
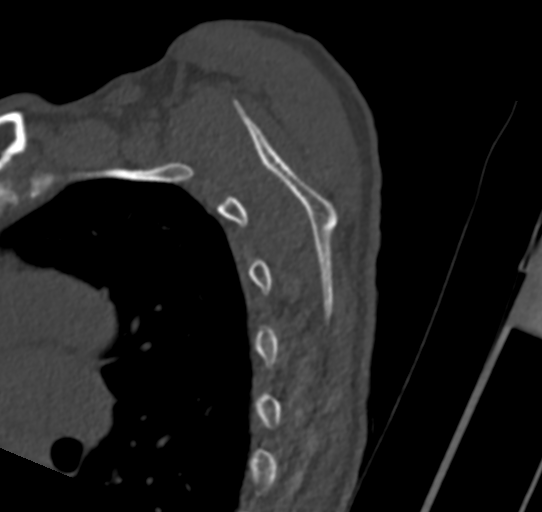

[Series 14: shoulder 0.60 br40 s3 thins soft · axial · 0.51mm/px · z∈[-883,-720]mm · 9 of 342 slices shown, 12 images]
[im 35/342  soft-tissue]
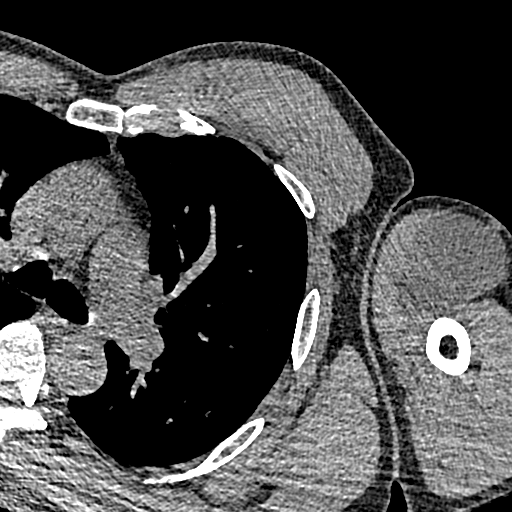
[im 35/342  bone]
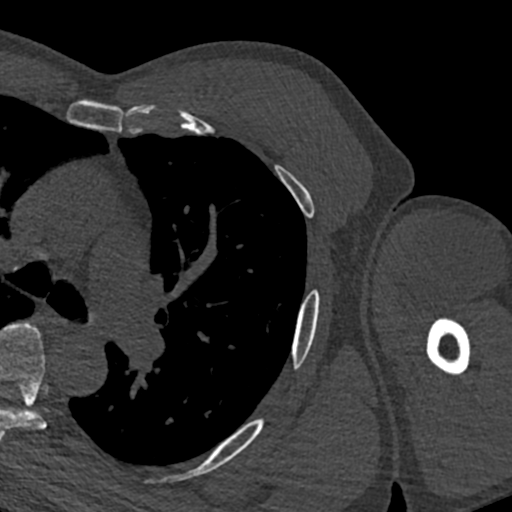
[im 69/342  bone]
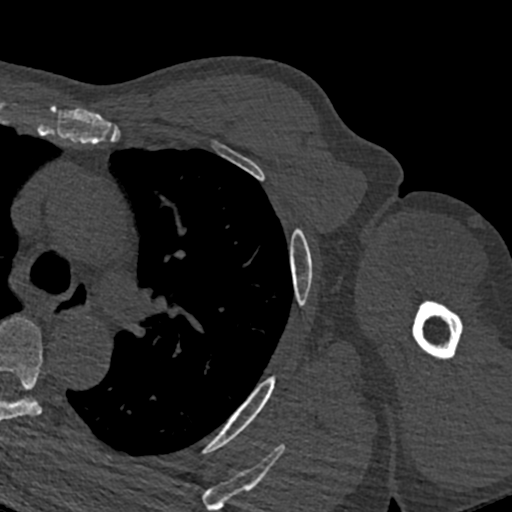
[im 103/342  bone]
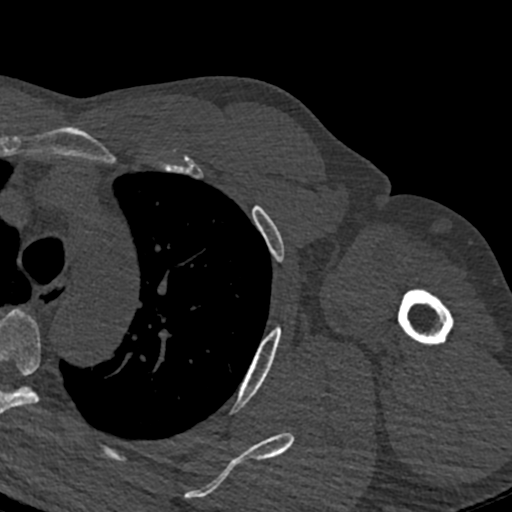
[im 137/342  bone]
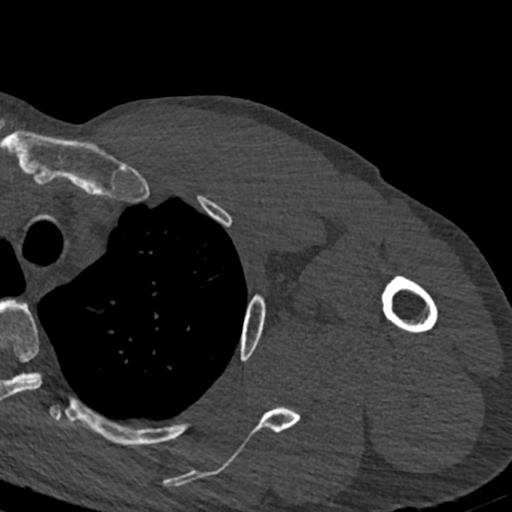
[im 171/342  soft-tissue]
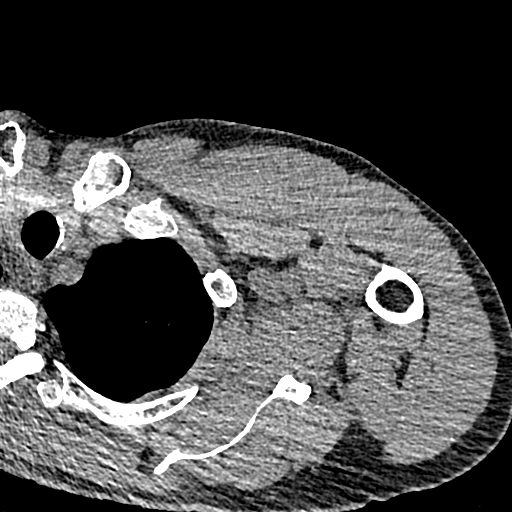
[im 171/342  bone]
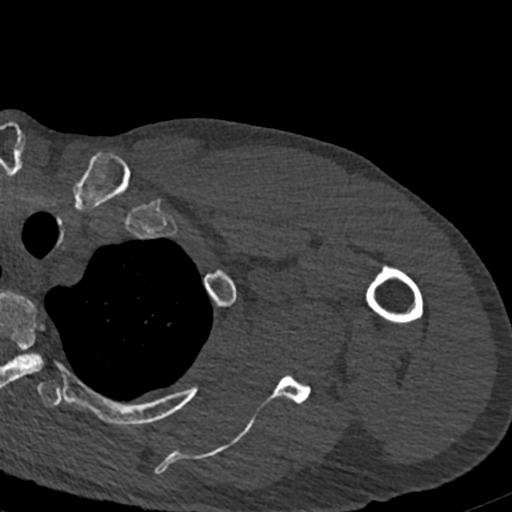
[im 205/342  bone]
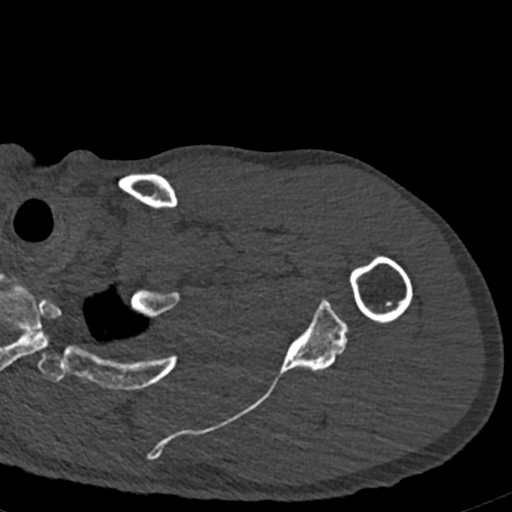
[im 239/342  bone]
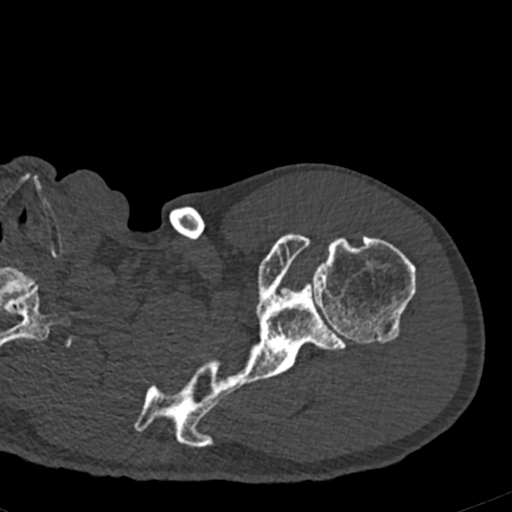
[im 273/342  bone]
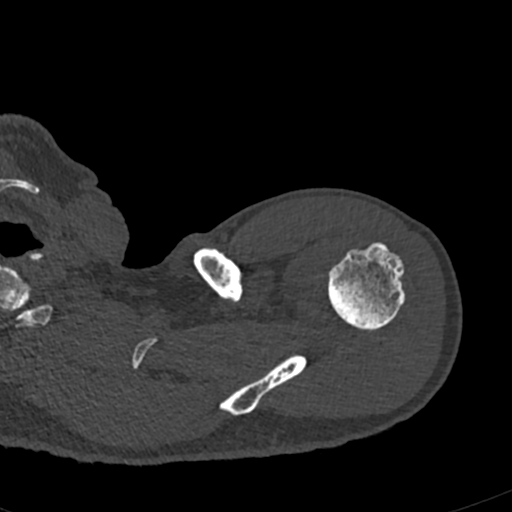
[im 307/342  soft-tissue]
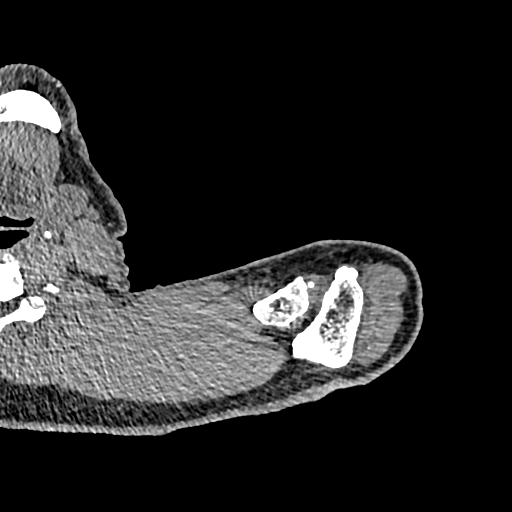
[im 307/342  bone]
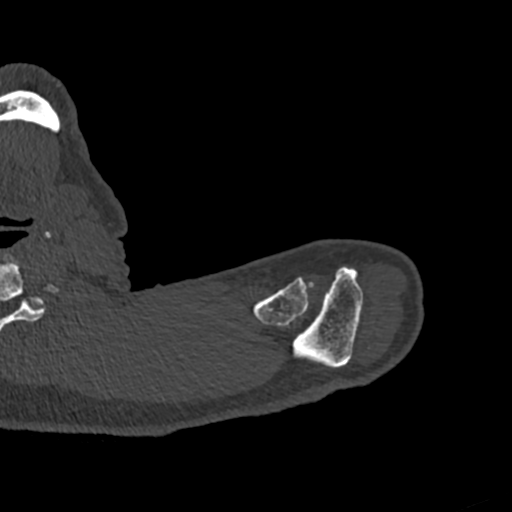

[14 of 29 positions shown; findings below may reference images not displayed]

FINDINGS: Bones/Joint/Cartilage

No fracture or dislocation.

Moderate to severe osteoarthritis of the glenohumeral joint with
joint space narrowing, subchondral sclerosis, and marginal
osteophytosis. 9 mm intra-articular body in the subscapularis
recess. 4 mm intra-articular body in the inferior bicipital groove.
Small glenohumeral joint effusion.

Normal acromioclavicular joint.  Type I acromion.

Ligaments

Ligaments are suboptimally evaluated by CT.

Muscles and Tendons
Grossly intact. No muscle atrophy. 5.4 x 4.9 x 5.2 cm round cystic
lesion in the proximal biceps muscle (series 2, image 89).

Soft tissue
The visualized left lung is clear.
IMPRESSION: 1. Moderate to severe osteoarthritis of the glenohumeral joint.
2. 5.4 cm round cystic lesion in the proximal biceps muscle,
nonspecific. This may be a ganglion cyst arising from fluid in the
biceps tendon sheath. Consider further evaluation with ultrasound or
MRI of the left humerus with and without contrast.

## 2023-07-20 IMAGING — CR DG SHOULDER 1V*L*
1 series · 1 of 1 positions shown · non-contrast
Comparison: None Available.

CLINICAL DATA: 490506 status post left shoulder prosthesis, to
check for the alignment

EXAM:
LEFT SHOULDER

[shoulder ap]
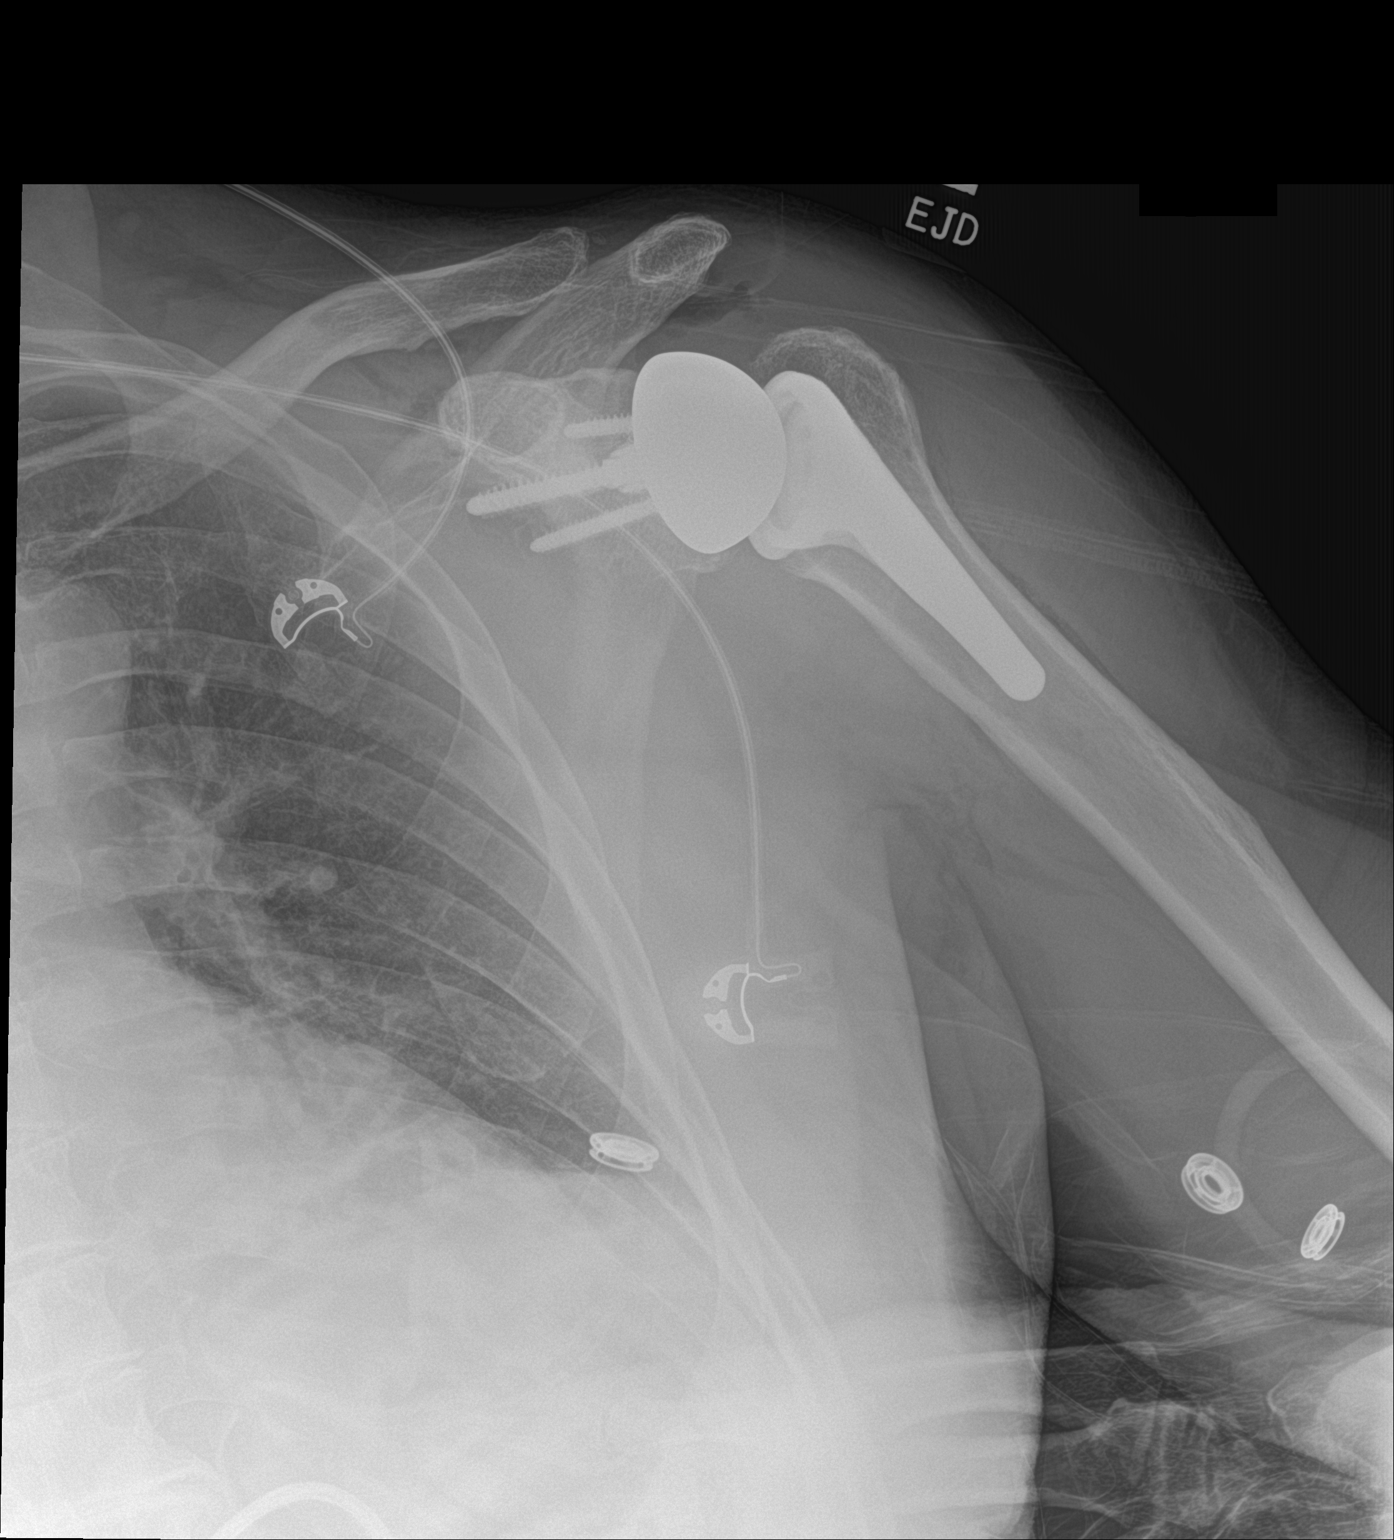

[1 of 1 positions shown; findings below may reference images not displayed]

FINDINGS: Frontal portable left shoulder x-ray is obtained. There are post
left shoulder prosthetic changes seen and the prosthetic components
are in near anatomical alignment. No periprosthetic lucency.
Visualized left lung is unremarkable. AC joint has a normal
appearance. Soft tissue emphysema of recent surgery.
IMPRESSION: Status post left shoulder prosthesis and the prosthetic components
are in near anatomical alignment.

## 2024-07-24 ENCOUNTER — Encounter: Payer: Self-pay | Admitting: Cardiology

## 2024-07-24 ENCOUNTER — Encounter: Payer: Self-pay | Admitting: *Deleted

## 2024-07-24 ENCOUNTER — Ambulatory Visit: Attending: Cardiology | Admitting: Cardiology

## 2024-07-24 ENCOUNTER — Ambulatory Visit

## 2024-07-24 VITALS — BP 140/70 | HR 71 | Ht 68.0 in | Wt 196.0 lb

## 2024-07-24 DIAGNOSIS — I10 Essential (primary) hypertension: Secondary | ICD-10-CM | POA: Diagnosis not present

## 2024-07-24 DIAGNOSIS — E782 Mixed hyperlipidemia: Secondary | ICD-10-CM

## 2024-07-24 DIAGNOSIS — R0609 Other forms of dyspnea: Secondary | ICD-10-CM

## 2024-07-24 DIAGNOSIS — I341 Nonrheumatic mitral (valve) prolapse: Secondary | ICD-10-CM | POA: Diagnosis not present

## 2024-07-24 DIAGNOSIS — I340 Nonrheumatic mitral (valve) insufficiency: Secondary | ICD-10-CM | POA: Diagnosis not present

## 2024-07-24 LAB — ECHOCARDIOGRAM COMPLETE
AR max vel: 2.77 cm2
AV Area VTI: 2.8 cm2
AV Area mean vel: 2.57 cm2
AV Mean grad: 4 mmHg
AV Peak grad: 7.5 mmHg
Ao pk vel: 1.37 m/s
Height: 68 in
MV M vel: 5.51 m/s
MV Peak grad: 121.3 mmHg
P 1/2 time: 680 ms
S' Lateral: 3.5 cm
Weight: 3136 [oz_av]

## 2024-07-24 NOTE — Patient Instructions (Signed)
 Medication Instructions:  Your physician recommends that you continue on your current medications as directed. Please refer to the Current Medication list given to you today.  *If you need a refill on your cardiac medications before your next appointment, please call your pharmacy*   Lab Work: None Ordered If you have labs (blood work) drawn today and your tests are completely normal, you will receive your results only by: MyChart Message (if you have MyChart) OR A paper copy in the mail If you have any lab test that is abnormal or we need to change your treatment, we will call you to review the results.   Testing/Procedures: Your physician has requested that you have an echocardiogram. Echocardiography is a painless test that uses sound waves to create images of your heart. It provides your doctor with information about the size and shape of your heart and how well your heart's chambers and valves are working. This procedure takes approximately one hour. There are no restrictions for this procedure. Please do NOT wear cologne, perfume, aftershave, or lotions (deodorant is allowed). Please arrive 15 minutes prior to your appointment time.  Please note: We ask at that you not bring children with you during ultrasound (echo/ vascular) testing. Due to room size and safety concerns, children are not allowed in the ultrasound rooms during exams. Our front office staff cannot provide observation of children in our lobby area while testing is being conducted. An adult accompanying a patient to their appointment will only be allowed in the ultrasound room at the discretion of the ultrasound technician under special circumstances. We apologize for any inconvenience.    Follow-Up: At [facility], you and your health needs are our priority.  As part of our continuing mission to provide you with exceptional heart care, we have created designated Provider Care Teams.  These Care Teams include your  primary Cardiologist (physician) and Advanced Practice Providers (APPs -  Physician Assistants and Nurse Practitioners) who all work together to provide you with the care you need, when you need it.  We recommend signing up for the patient portal called "MyChart".  Sign up information is provided on this After Visit Summary.  MyChart is used to connect with patients for Virtual Visits (Telemedicine).  Patients are able to view lab/test results, encounter notes, upcoming appointments, etc.  Non-urgent messages can be sent to your provider as well.   To learn more about what you can do with MyChart, go to [email or web address].    Your next appointment:   3 month(s)  The format for your next appointment:   In Person  Provider:   Gypsy Balsam, MD    Other Instructions NA

## 2024-07-24 NOTE — Progress Notes (Unsigned)
 Cardiology Consultation:    Date:  07/24/2024   ID:  Cap Massi, DOB 24-Nov-1950, MRN [id number]  PCP:  Thurmond Cathlyn LABOR., MD  Cardiologist:  Lamar Fitch, MD   Referring MD: Thurmond Cathlyn LABOR., MD   No chief complaint on file. I have a heart murmur  History of Present Illness:    Philip Preston is a 73 y.o. male who is being seen today for the evaluation of heart murmur at the request of Thurmond Cathlyn LABOR., MD. past medical history significant for essential hypertension, heart murmur that he had for almost all his life, also dyslipidemia, he went for annual physical to his primary care physician, heart murmur has been heard which seems to evaluate louder than previously he was referred to us  for evaluation.  He tells me that he did have a heart murmur all his life, however, lately this heart murmur became more pronounced he does not have any issues with that.  He exercised on the regular basis he has been lifting weights and doing aerobic exercises all his life did not notice any decrease in ability to exercise no shortness of breath no palpitation chest pain tightness squeezing pressure burning chest, overall seems to be doing well.  He never smoked, does not have family history of premature coronary artery disease  Past Medical History:  Diagnosis Date   BPH associated with nocturia    ED (erectile dysfunction)    GERD (gastroesophageal reflux disease)    OA (osteoarthritis)    knees,    Wears glasses     Past Surgical History:  Procedure Laterality Date   APPENDECTOMY  age 80   BICEPS TENDON REPAIR Left 2012   COLONOSCOPY  04/2017   JOINT REPLACEMENT     bil TKR   KNEE ARTHROSCOPY Left x2  last one 1990   LAPAROSCOPIC CHOLECYSTECTOMY  2012   LUMBAR SPINE SURGERY  1987   REVERSE SHOULDER ARTHROPLASTY Left 01/19/2022   Procedure: REVERSE SHOULDER ARTHROPLASTY;  Surgeon: Cristy Bonner DASEN, MD;  Location: [facility];  Service: Orthopedics;  Laterality:  Left;   TONSILLECTOMY AND ADENOIDECTOMY  child   TOTAL KNEE ARTHROPLASTY Left 05/06/2018   Procedure: LEFT TOTAL KNEE ARTHROPLASTY;  Surgeon: Rubie Kemps, MD;  Location: [facility];  Service: Orthopedics;  Laterality: Left;  Adductor Block   TOTAL KNEE ARTHROPLASTY Right 12/16/2018   Procedure: TOTAL KNEE ARTHROPLASTY;  Surgeon: Rubie Kemps, MD;  Location: [facility];  Service: Orthopedics;  Laterality: Right;   TRICEPS TENDON REPAIR Right x2  last one 2012   first time repair of tendon then post infection surgery   UPPER GI ENDOSCOPY      Current Medications: Active Medications[1]   Allergies:   Levofloxacin   Social History   Socioeconomic History   Marital status: Married    Spouse name: Not on file   Number of children: Not on file   Years of education: Not on file   Highest education level: Not on file  Occupational History   Not on file  Tobacco Use   Smoking status: Never   Smokeless tobacco: Never  Vaping Use   Vaping status: Never Used  Substance and Sexual Activity   Alcohol use: Never   Drug use: Never   Sexual activity: Yes  Other Topics Concern   Not on file  Social History Narrative   Not on file   Social Drivers of Health   Tobacco Use: Low Risk (07/24/2024)   Patient History  Smoking Tobacco Use: Never    Smokeless Tobacco Use: Never    Passive Exposure: Not on file  Financial Resource Strain: Not on file  Food Insecurity: Low Risk (07/09/2024)   Received from [facility]   Epic    Within the past 12 months, you worried that your food would run out before you got money to buy more: Never true    Within the past 12 months, the food you bought just didn't last and you didn't have money to get more. : Never true  Transportation Needs: No Transportation Needs (07/09/2024)   Received from [facility]    In the past 12 months, has lack of reliable transportation kept you from medical appointments, meetings, work or from getting things  needed for daily living? : No  Physical Activity: Not on file  Stress: Not on file  Social Connections: Not on file  Depression (EYV7-0): Not on file  Alcohol Screen: Not on file  Housing: Low Risk (07/09/2024)   Received from [facility]   Epic    What is your living situation today?: I have a steady place to live    Think about the place you live. Do you have problems with any of the following? Choose all that apply:: None/None on this list  Utilities: Low Risk (07/09/2024)   Received from [facility]   Utilities    In the past 12 months has the electric, gas, oil, or water  company threatened to shut off services in your home? : No  Health Literacy: Not on file     Family History: The patient's family history is not on file. ROS:   Please see the history of present illness.    All 14 point review of systems negative except as described per history of present illness.  EKGs/Labs/Other Studies Reviewed:    The following studies were reviewed today:   EKG:  EKG Interpretation Date/Time:  Thursday July 24 2024 10:38:24 EST Ventricular Rate:  71 PR Interval:  210 QRS Duration:  74 QT Interval:  372 QTC Calculation: 404 R Axis:   34  Text Interpretation: Sinus rhythm with 1st degree A-V block No previous ECGs available Confirmed by Bernie Charleston [phone]) on 07/24/2024 10:39:42 AM    Recent Labs: No results found for requested labs within last 365 days.  Recent Lipid Panel No results found for: CHOL, TRIG, HDL, CHOLHDL, VLDL, LDLCALC, LDLDIRECT  Physical Exam:    VS:  BP (!) 140/70   Pulse 71   Ht 5' 8 (1.727 m)   Wt 196 lb (88.9 kg)   SpO2 99%   BMI 29.80 kg/m     Wt Readings from Last 3 Encounters:  07/24/24 196 lb (88.9 kg)  01/19/22 188 lb 4.4 oz (85.4 kg)  12/16/18 187 lb 6.3 oz (85 kg)     GEN:  Well nourished, well developed in no acute distress HEENT: Normal NECK: No JVD; No carotid bruits LYMPHATICS: No  lymphadenopathy CARDIAC: RRR, midsystolic click with late systolic murmur 2-3/6 noted best heard at left border of sternum, no rubs, no gallops RESPIRATORY:  Clear to auscultation without rales, wheezing or rhonchi  ABDOMEN: Soft, non-tender, non-distended MUSCULOSKELETAL:  No edema; No deformity  SKIN: Warm and dry NEUROLOGIC:  Alert and oriented x 3 PSYCHIATRIC:  Normal affect   ASSESSMENT:    1. Essential hypertension   2. Dyspnea on exertion   3. Nonrheumatic mitral valve regurgitation   4. Mitral valve prolapse  5. Mixed hyperlipidemia    PLAN:    In order of problems listed above:  Essential hypertension first visit to my office.  Will continue monitoring, without adjustment of his medications. Mitral valve prolapse with mitral valve regurgitation.  Will schedule him to have echocardiogram today to assess the lesion.  Likely clinically he is doing very well.  He may require transesophageal echocardiogram. Mixed dyslipidemia: I did review KPN which show me his LDL of 128 HDL 61.  Will not do any ischemia workup at this stage since he is completely asymptomatic.  Will wait for results of his echocardiogram and determine what testing need to be done to fully assess condition of his heart.   Medication Adjustments/Labs and Tests Ordered: Current medicines are reviewed at length with the patient today.  Concerns regarding medicines are outlined above.  Orders Placed This Encounter  Procedures   EKG 12-Lead   ECHOCARDIOGRAM COMPLETE   No orders of the defined types were placed in this encounter.   Signed, Lamar DOROTHA Fitch, MD, [facility]. 07/24/2024 1:12 PM    [facility]     [1]  Current Meds  Medication Sig   Ascorbic Acid (VITAMIN C) 1000 MG tablet Take 1,000 mg by mouth daily.   Misc Natural Products (GLUCOSAMINE CHONDROITIN TRIPLE) TABS Take 1 tablet by mouth daily.   Multiple Vitamin (MULTIVITAMIN WITH MINERALS) TABS tablet Take 1 tablet by  mouth daily.   Omega-3 Fatty Acids (FISH OIL) 1000 MG CAPS Take 1,000 mg by mouth daily.   pantoprazole  (PROTONIX ) 20 MG tablet Take 20 mg by mouth daily.   tadalafil (CIALIS) 10 MG tablet Take 10 mg by mouth daily as needed for erectile dysfunction.

## 2024-07-25 ENCOUNTER — Ambulatory Visit: Payer: Self-pay | Admitting: Cardiology

## 2024-07-25 ENCOUNTER — Telehealth: Payer: Self-pay

## 2024-07-25 NOTE — Telephone Encounter (Signed)
 Left message on My Chart with Echo results per Dr. Karry note. Routed to PCP.

## 2024-10-22 ENCOUNTER — Ambulatory Visit: Admitting: Cardiology
